# Patient Record
Sex: Female | Born: 1955 | Race: White | Hispanic: No | Marital: Married | Smoking: Never smoker
Health system: Southern US, Community
[De-identification: ages and names within clinical notes are randomized; demographics above are authoritative.]

## PROBLEM LIST (undated history)

## (undated) DIAGNOSIS — E079 Disorder of thyroid, unspecified: Secondary | ICD-10-CM

## (undated) DIAGNOSIS — Z9889 Other specified postprocedural states: Secondary | ICD-10-CM

## (undated) DIAGNOSIS — M1990 Unspecified osteoarthritis, unspecified site: Secondary | ICD-10-CM

## (undated) DIAGNOSIS — E039 Hypothyroidism, unspecified: Secondary | ICD-10-CM

## (undated) DIAGNOSIS — R112 Nausea with vomiting, unspecified: Secondary | ICD-10-CM

## (undated) HISTORY — DX: Disorder of thyroid, unspecified: E07.9

---

## 1998-09-11 ENCOUNTER — Ambulatory Visit (HOSPITAL_COMMUNITY): Admission: RE | Admit: 1998-09-11 | Discharge: 1998-09-11 | Payer: Self-pay | Admitting: *Deleted

## 1998-09-12 ENCOUNTER — Ambulatory Visit (HOSPITAL_COMMUNITY): Admission: RE | Admit: 1998-09-12 | Discharge: 1998-09-12 | Payer: Self-pay | Admitting: *Deleted

## 1998-10-15 ENCOUNTER — Encounter: Payer: Self-pay | Admitting: *Deleted

## 1998-10-15 ENCOUNTER — Ambulatory Visit (HOSPITAL_COMMUNITY): Admission: RE | Admit: 1998-10-15 | Discharge: 1998-10-15 | Payer: Self-pay | Admitting: *Deleted

## 1998-10-25 HISTORY — PX: CHOLECYSTECTOMY: SHX55

## 2000-11-11 ENCOUNTER — Emergency Department (HOSPITAL_COMMUNITY): Admission: EM | Admit: 2000-11-11 | Discharge: 2000-11-11 | Payer: Self-pay | Admitting: Emergency Medicine

## 2000-11-11 ENCOUNTER — Encounter: Payer: Self-pay | Admitting: Gastroenterology

## 2000-11-12 ENCOUNTER — Ambulatory Visit (HOSPITAL_COMMUNITY): Admission: RE | Admit: 2000-11-12 | Discharge: 2000-11-12 | Payer: Self-pay | Admitting: Gastroenterology

## 2000-11-14 ENCOUNTER — Encounter: Payer: Self-pay | Admitting: Gastroenterology

## 2000-11-14 ENCOUNTER — Ambulatory Visit (HOSPITAL_COMMUNITY): Admission: RE | Admit: 2000-11-14 | Discharge: 2000-11-14 | Payer: Self-pay | Admitting: Gastroenterology

## 2004-09-24 HISTORY — PX: JOINT REPLACEMENT: SHX530

## 2005-03-30 ENCOUNTER — Ambulatory Visit: Payer: Self-pay | Admitting: Specialist

## 2005-05-03 ENCOUNTER — Ambulatory Visit: Payer: Self-pay | Admitting: Specialist

## 2006-09-24 DIAGNOSIS — M1990 Unspecified osteoarthritis, unspecified site: Secondary | ICD-10-CM

## 2006-09-24 HISTORY — DX: Unspecified osteoarthritis, unspecified site: M19.90

## 2007-09-25 HISTORY — PX: COLONOSCOPY: SHX174

## 2007-12-17 ENCOUNTER — Ambulatory Visit (HOSPITAL_BASED_OUTPATIENT_CLINIC_OR_DEPARTMENT_OTHER): Admission: RE | Admit: 2007-12-17 | Discharge: 2007-12-17 | Payer: Self-pay | Admitting: Orthopedic Surgery

## 2008-01-06 ENCOUNTER — Encounter: Payer: Self-pay | Admitting: Orthopedic Surgery

## 2008-01-23 ENCOUNTER — Encounter: Payer: Self-pay | Admitting: Orthopedic Surgery

## 2008-02-23 ENCOUNTER — Encounter: Payer: Self-pay | Admitting: Orthopedic Surgery

## 2008-03-24 ENCOUNTER — Encounter: Payer: Self-pay | Admitting: Orthopedic Surgery

## 2010-09-24 HISTORY — PX: JOINT REPLACEMENT: SHX530

## 2011-02-06 NOTE — Op Note (Signed)
NAMEJENI, Gabriela                 ACCOUNT NO.:  [id number]   MEDICAL RECORD NO.:  [id number]          PATIENT TYPE:  AMB   LOCATION:  [facility]                         FACILITY:  [facility]   PHYSICIAN:  Ollen Gross, M.D.    DATE OF BIRTH:  04/13/1956   DATE OF PROCEDURE:  12/17/2007  DATE OF DISCHARGE:                               OPERATIVE REPORT   PREOPERATIVE DIAGNOSIS:  Left knee medial meniscal tear.   POSTOPERATIVE DIAGNOSIS:  Left knee medial meniscal tear plus chondral  defect.   PROCEDURE:  Left knee arthroscopy with meniscal debridement and  chondroplasty.   SURGEON:  Ollen Gross, M.D.   ASSISTANT:  None.   ANESTHESIA:  General.   ESTIMATED BLOOD LOSS:  Minimal.   DRAINS:  None.   COMPLICATIONS:  None.   CONDITION:  Stable to recovery.   BRIEF CLINICAL NOTE:  Gabriela [name] is a 55 year old female with very  significant left knee pain and mechanical symptoms.  She had an exam and  history consistent with meniscal tear confirmed by MRI.  She presents  now for meniscal debridement and chondroplasty.   PROCEDURE IN DETAIL:  After successful administration of general  anesthetic, tourniquet was placed high on the left thigh, left lower  extremity prepped and draped in the usual sterile fashion.  Standard  superomedial and inferolateral portals are established.  Inflow cannula  passed superomedial and camera passed inferolateral.  Arthroscopic  visualization proceeds.  Undersurface of the patella has grade II  changes and so does the trochlea.  The medial and lateral gutters were  visualized and there were no loose bodies.  Flexion and valgus force was  applied to the knee and the medial compartment was entered.  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  Spinal needle was used to localize  the inferomedial portal.  A small  incision made and dilator placed.  The  meniscus was debrided back to a stable base with baskets and a 4.2 mm  shaver.  Chondromalacia on the medial femoral condyle and tibial plateau  was debrided back to a stable base with the shaver.  On the medial  femoral condyle about a 1 x 2 cm area of the cartilage was unstable  almost down to bone.  There was very little cartilage covering the bone  in that the area.  There was some lower grade chondromalacia surrounding  that and then the rest of the condyle looked normal.  Medial tibial  plateau just had some grade I change.  The intercondylar notch was  visualized.  There was a fair amount of synovitis but the ACL and PCL  looked normal.  I debrided this hypertrophic synovium with the  ArthroCare.  The lateral compartment was entered and it looks normal.   We then addressed the patellofemoral compartment and the unstable  cartilage on the surface of the trochlea is debrided back to a stable  base.  This was involving most of the  central trochlea and medial  trochlea.  It was not down all the way to exposed bone.  At the  undersurface of the patella we also debrided the cartilage back to a  stable base.  The joints were again inspected.  No further tears or  loose bodies.  Arthroscopic equipment was removed from the inferior  portals which were closed with interrupted 4-0 nylon.  Twenty mL of  quarter percent Marcaine with epi are injected through the inflow  cannula which was then removed.  That portal was closed with nylon.  A  bulky sterile dressing is applied and she is awakened and transported to  recovery in stable condition.      Ollen Gross, M.D.  Electronically Signed     FA/MEDQ  D:  12/17/2007  T:  12/17/2007  Job:  [id number]

## 2011-03-12 ENCOUNTER — Other Ambulatory Visit: Payer: Self-pay | Admitting: Orthopedic Surgery

## 2011-03-12 ENCOUNTER — Encounter (HOSPITAL_COMMUNITY): Payer: BC Managed Care – PPO

## 2011-03-12 LAB — URINALYSIS, ROUTINE W REFLEX MICROSCOPIC
Bilirubin Urine: NEGATIVE
Glucose, UA: NEGATIVE mg/dL
Hgb urine dipstick: NEGATIVE
Ketones, ur: NEGATIVE mg/dL
Leukocytes, UA: NEGATIVE
Nitrite: NEGATIVE
Protein, ur: NEGATIVE mg/dL
Specific Gravity, Urine: 1.01 (ref 1.005–1.030)
Urobilinogen, UA: 0.2 mg/dL (ref 0.0–1.0)
pH: 8 (ref 5.0–8.0)

## 2011-03-12 LAB — CBC
HCT: 46.3 % — ABNORMAL HIGH (ref 36.0–46.0)
Hemoglobin: 15.5 g/dL — ABNORMAL HIGH (ref 12.0–15.0)
MCH: 28.2 pg (ref 26.0–34.0)
MCHC: 33.5 g/dL (ref 30.0–36.0)
MCV: 84.2 fL (ref 78.0–100.0)
Platelets: 242 10*3/uL (ref 150–400)
RBC: 5.5 MIL/uL — ABNORMAL HIGH (ref 3.87–5.11)
RDW: 12.7 % (ref 11.5–15.5)
WBC: 5.5 10*3/uL (ref 4.0–10.5)

## 2011-03-12 LAB — SURGICAL PCR SCREEN
MRSA, PCR: NEGATIVE
Staphylococcus aureus: NEGATIVE

## 2011-03-12 LAB — COMPREHENSIVE METABOLIC PANEL
ALT: 23 U/L (ref 0–35)
AST: 23 U/L (ref 0–37)
Albumin: 4.3 g/dL (ref 3.5–5.2)
Alkaline Phosphatase: 118 U/L — ABNORMAL HIGH (ref 39–117)
BUN: 14 mg/dL (ref 6–23)
CO2: 28 mEq/L (ref 19–32)
Calcium: 10.2 mg/dL (ref 8.4–10.5)
Chloride: 102 mEq/L (ref 96–112)
Creatinine, Ser: 0.62 mg/dL (ref 0.50–1.10)
GFR calc Af Amer: >60 mL/min (ref >60)
GFR calc non Af Amer: >60 mL/min (ref >60)
Glucose, Bld: 86 mg/dL (ref 70–99)
Potassium: 4 mEq/L (ref 3.5–5.1)
Sodium: 138 mEq/L (ref 135–145)
Total Bilirubin: 0.5 mg/dL (ref 0.3–1.2)
Total Protein: 7.4 g/dL (ref 6.0–8.3)

## 2011-03-12 LAB — APTT: aPTT: 30 seconds (ref 24–37)

## 2011-03-12 LAB — PROTIME-INR
INR: 0.94 (ref 0.00–1.49)
Prothrombin Time: 12.8 seconds (ref 11.6–15.2)

## 2011-03-19 ENCOUNTER — Inpatient Hospital Stay (HOSPITAL_COMMUNITY)
Admission: RE | Admit: 2011-03-19 | Discharge: 2011-03-22 | DRG: 209 | Disposition: A | Discharge status: Home Health | Payer: BC Managed Care – PPO | Source: Ambulatory Visit | Attending: Orthopedic Surgery | Admitting: Orthopedic Surgery

## 2011-03-19 DIAGNOSIS — E039 Hypothyroidism, unspecified: Secondary | ICD-10-CM | POA: Diagnosis present

## 2011-03-19 DIAGNOSIS — Z01812 Encounter for preprocedural laboratory examination: Secondary | ICD-10-CM

## 2011-03-19 DIAGNOSIS — M1710 Unilateral primary osteoarthritis, unspecified knee: Principal | ICD-10-CM | POA: Diagnosis present

## 2011-03-19 DIAGNOSIS — I9589 Other hypotension: Secondary | ICD-10-CM | POA: Diagnosis not present

## 2011-03-19 LAB — ABO/RH: ABO/RH(D): A POS

## 2011-03-20 LAB — CBC
HCT: 36.3 % (ref 36.0–46.0)
MCV: 84 fL (ref 78.0–100.0)
Platelets: 167 10*3/uL (ref 150–400)
RBC: 4.32 MIL/uL (ref 3.87–5.11)
RDW: 12.7 % (ref 11.5–15.5)
WBC: 6.8 10*3/uL (ref 4.0–10.5)

## 2011-03-20 LAB — BASIC METABOLIC PANEL
BUN: 11 mg/dL (ref 6–23)
CO2: 25 mEq/L (ref 19–32)
Chloride: 106 mEq/L (ref 96–112)
Creatinine, Ser: 0.57 mg/dL (ref 0.50–1.10)
GFR calc Af Amer: >60 mL/min (ref >60)
Potassium: 4.2 mEq/L (ref 3.5–5.1)

## 2011-03-21 LAB — BASIC METABOLIC PANEL
BUN: 9 mg/dL (ref 6–23)
Chloride: 104 mEq/L (ref 96–112)
Creatinine, Ser: 0.55 mg/dL (ref 0.50–1.10)
GFR calc Af Amer: >60 mL/min (ref >60)
Glucose, Bld: 110 mg/dL — ABNORMAL HIGH (ref 70–99)
Potassium: 3.6 mEq/L (ref 3.5–5.1)

## 2011-03-21 LAB — CBC
HCT: 34.2 % — ABNORMAL LOW (ref 36.0–46.0)
Hemoglobin: 11.1 g/dL — ABNORMAL LOW (ref 12.0–15.0)
MCV: 84.7 fL (ref 78.0–100.0)
RDW: 12.8 % (ref 11.5–15.5)
WBC: 9 10*3/uL (ref 4.0–10.5)

## 2011-03-21 NOTE — Op Note (Signed)
Gabriela [name], KULAKOWSKI NO.:  [id number]  MEDICAL RECORD NO.:  [id number]  LOCATION:  1617                         FACILITY:  [facility]  PHYSICIAN:  Ollen Gross, M.D.    DATE OF BIRTH:  [birth year]  DATE OF PROCEDURE:  03/19/2011 DATE OF DISCHARGE:                              OPERATIVE REPORT   PREOPERATIVE DIAGNOSIS:  Osteoarthritis right knee.  POSTOPERATIVE DIAGNOSIS:  Osteoarthritis right knee.  PROCEDURE:  Right total knee arthroplasty.  SURGEON:  Ollen Gross, M.D.  ASSISTANT:  Alexzandrew L. Perkins, P.A.C.  ANESTHESIA:  Spinal.  ESTIMATED BLOOD LOSS:  Minimal.  DRAINS:  Hemovac x1.  TOURNIQUET TIME:  37 minutes at 300 mmHg.  COMPLICATIONS:  None.  CONDITION:  Stable to recovery room.  BRIEF CLINICAL NOTE:  Gabriela [name] is a 55 year old female, who has advanced end-stage arthritis of the knees, right more symptomatic than the left. She has failed nonoperative management and presents for total knee arthroplasty. PROCEDURE IN DETAIL:  After successful administration of spinal anesthetic, a tourniquet was placed high on the right thigh and right lower extremity prepped and draped in a usual sterile fashion. Extremities were wrapped in Esmarch, knee flexed, tourniquet inflated to 300 mmHg.  A midline incision was made with 10 blade through subcutaneous tissue to the level of the extensor mechanism.  A fresh blade is used make a medial parapatellar arthrotomy.  Soft tissue on the proximal medial tibia is subperiosteally elevated to the joint line with the knife into the semimembranosus bursa with a Cobb elevator.  Soft tissue laterally is elevated with attention being paid to avoid the patellar tendon on tibial tubercle.  The patella was everted, knee flexed to 90 degrees and ACL and PCL removed.  Drill was used create a starting hole in the distal femur.  The canal was thoroughly irrigated. The 5-degree right valgus alignment guide is placed and  distal femoral cutting block is pinned to remove 10 mm off the distal femur.  Resection is made with an oscillating saw.  Tibia subluxed forward and the menisci are removed.  The extramedullary tibial alignment guide is placed referencing proximally at the medial aspect of the tibial tubercle and distally along the second metatarsal axis and tibial crest.  The block is pinned to remove 2 mm off the more deficient medial side.  Tibial resection is made with an oscillating saw.  Size 3 is the most appropriate tibial component.  The proximal tibia was prepared with modular drill and keel punch for the size 3.  The femoral sizing guide is placed and size 3 is most appropriate. Rotation is marked off the epicondylar axis and confirmed by creating rectangular flexion gap at 90 degrees.  The block is pinned in that rotation and the anterior-posterior chamfer cuts are made. Intercondylar block is placed and that cut is made.  Trial size 3 posterior stabilized femur was placed.  The 12.5 mm posterior stabilized rotating platform insert was placed.  There is a little play with this and the 15 mm, which allowed for full extension with excellent varus- valgus and an anterior-posterior balance throughout full range of motion.  The patella was everted and thickness measured  22 mm.  Freehand resection taken to 12 mm, 38 template is placed, lug holes were drilled, trial patella was placed and it tracks normally.  Osteophytes removed off the posterior femur with the trial in place.  All trials are were removed and the cut bone surfaces prepared with pulsatile lavage. Cement is mixed and once ready for implantation, the size 3 mobile bearing tibial tray, size 3 posterior stabilized femur and 38 patella are cemented into place and patella was held with a clamp.  The trial 15- mm insert is placed, knee held in full extension, all extruded cement removed.  Cement fully hardened when the permanent 15-mm  posterior stabilized rotating platform insert was placed in the tibial tray.  The wound was copiously irrigated with saline solution and the arthrotomy closed over Hemovac drain with interrupted #1 PDS.  Flexion against gravity about 135 degrees and the patella tracks normally.  Tourniquet is released at a total time of 37 minutes.  Subcutaneous was closed with interrupted 2-0 Vicryl and subcuticular running 4-0 Monocryl.  Catheter for Marcaine pain pump was placed and pump is initiated.  The incisions cleaned and dried.  Steri-Strips and bulky sterile dressing were applied.  She is then placed into a knee immobilizer, awakened and transported to recovery in stable condition.     Ollen Gross, M.D.     FA/MEDQ  D:  03/19/2011  T:  03/19/2011  Job:  [id number]  Electronically Signed by Ollen Gross M.D. on 03/21/2011 10:09:20 AM

## 2011-03-22 LAB — CBC
HCT: 32.3 % — ABNORMAL LOW (ref 36.0–46.0)
Hemoglobin: 10.7 g/dL — ABNORMAL LOW (ref 12.0–15.0)
MCH: 27.6 pg (ref 26.0–34.0)
MCHC: 33.1 g/dL (ref 30.0–36.0)
MCV: 83.5 fL (ref 78.0–100.0)
RBC: 3.87 MIL/uL (ref 3.87–5.11)

## 2011-04-05 ENCOUNTER — Encounter: Payer: Self-pay | Admitting: Orthopedic Surgery

## 2011-04-09 NOTE — H&P (Signed)
  NAMEAZANI, BROGDON NO.:  [id number]  MEDICAL RECORD NO.:  [id number]  LOCATION:                                 FACILITY:  PHYSICIAN:  Ollen Gross, M.D.    DATE OF BIRTH:  June 22, 1956  DATE OF ADMISSION: DATE OF DISCHARGE:                             HISTORY & PHYSICAL   DATE OF ADMISSION:  March 19, 2011  CHIEF COMPLAINT:  Right knee pain.  HISTORY OF PRESENT ILLNESS:  The patient is a 55 year old female who has been seen by Dr. Lequita Halt for ongoing right knee pain.  She has a known significant arthritis, progressively getting worse.  It is felt she would benefit from undergoing surgical intervention.  Risks and benefits have been discussed.  She elected to proceed with surgery.  ALLERGIES:  ERYTHROMYCIN, SULFA, AMPICILLIN, TETRACYCLINE.  INTOLERANCES:  VICODIN (please note she is able to take oxycodone).  CURRENT MEDICATIONS:  Armour Thyroid.  PAST MEDICAL HISTORY:  Hypothyroidism, postmenopausal.  PAST SURGICAL HISTORY:  Cholecystectomy, right knee meniscal repair, left knee meniscal repair.  FAMILY HISTORY:  Father with pancreatic cancer.  SOCIAL HISTORY:  Married.  Network engineer.  Nonsmoker.  No alcohol. She does have caregiver lined up.  She does have a living will.  REVIEW OF SYSTEMS:  GENERAL:  No fevers, chills, or night sweats. NEUROLOGIC:  No seizures, syncope, or paralysis.  RESPIRATORY:  No shortness of breath, productive cough, or hemoptysis.  CARDIOVASCULAR: No chest pain or orthopnea.  GI:  No nausea, vomiting, diarrhea, or constipation.  GU:  No dysuria, hematuria, or discharge. MUSCULOSKELETAL:  Right knee.  PHYSICAL EXAMINATION:  VITAL SIGNS:  Pulse 76, respirations 12, blood pressure 134/92. GENERAL:  55 year old white female, well nourished, well developed, tall frame, alert, oriented and cooperative, very pleasant, excellent historian. HEENT:  Normocephalic, atraumatic.  Pupils are round and reactive.   EOMs intact. NECK:  Supple. CHEST:  Clear. HEART:  Regular rate and rhythm without murmur, S1 and S2. ABDOMEN:  Soft, nontender.  Bowel sounds present. RECTAL/BREASTS/GENITALIA:  Not done, not part of present illness. EXTREMITIES:  Right knee, significant varus malalignment deformity, range of motion 5 to 130, marked crepitus, tender more medial than lateral, no instability.  IMPRESSION:  Osteoarthritis, right knee.  PLAN:  The patient will be admitted to [facility] to undergo a right total knee replacement arthroplasty.  Surgery will be performed by Dr. Ollen Gross.     Alexzandrew L. Julien Girt, P.A.C.   ______________________________ Ollen Gross, M.D.    ALP/MEDQ  D:  03/27/2011  T:  03/27/2011  Job:  [id number]  cc:   Allena Napoleon Fax: [phone]  Electronically Signed by Patrica Duel P.A.C. on 04/05/2011 10:38:35 AM Electronically Signed by Ollen Gross M.D. on 04/09/2011 06:52:20 AM

## 2011-04-19 NOTE — Discharge Summary (Signed)
[name], [name] NO.:  [id number]  MEDICAL RECORD NO.:  [id number]  LOCATION:  1617                         FACILITY:  [facility]  PHYSICIAN:  Gabriela [name], M.D.    DATE OF BIRTH:  10/26/1955  DATE OF ADMISSION:  03/19/2011 DATE OF DISCHARGE:  03/22/2011                              DISCHARGE SUMMARY   ADMITTING DIAGNOSES: 1. Osteoarthritis, right knee. 2. Hypothyroidism. 3. Postmenopausal.  DISCHARGE DIAGNOSES: 1. Osteoarthritis, right knee, status post right total knee     replacement arthroplasty. 2. Hypothyroidism. 3. Postmenopausal.  PROCEDURES:  On March 19, 2011, right total knee.  SURGEON:  Gabriela [name], M.D.  ASSISTANT:  Gabriela [name], P.A.C.  ANESTHESIA:  Spinal anesthesia.  TOURNIQUET TIME:  37 minutes.  CONSULTS:  None.  BRIEF HISTORY:  The patient is a 55 year old female with advanced arthritis of the right knee, right more symptomatic than left, now presents for a total knee arthroplasty.  LABORATORY DATA:  Admission CBC showed a hemoglobin of 15.5.  Serial CBCs were followed.  Hemoglobin dropped down from 11.9 to 11.1, last H  H at 10.7 and 32.3.  Chem panel not found in the chart, not scanned in, but the BMETs were followed for 48 hours.  Electrolytes remained all within normal limits.  The blood group type A+.  HOSPITAL COURSE:  The patient was admitted to [facility], taken to OR, underwent above-stated procedure without complication.  The patient tolerated the procedure well.  Later transferred to recovery room from orthopedic floor, started on p.o. and IV analgesic pain control following surgery given 24 hours postop IV antibiotics.  Started on Xarelto for DVT prophylaxis.  Allowed to be weightbearing as tolerated.  Did have a little bit of nausea on day #1, so we discontinued the PCA and switched over to different meds.  Hemovac drain was pulled.  Started, again, up out of bed on day #1.  By day #2,  nausea had improved, she was doing well, progressing with therapy, dressing change, incision looked good, no signs of infection, hemoglobin is 11.1, continue with therapy; and by day #3, the patient was doing well and had a vagal episode at the night before and had rapid response team come, her pressure had dropped down into the 60s but had responded, she was back up to 102 and on morning rounds, her pressure systolic was 114, we checked orthostatics and gave her additional fluids as needed.  We are monitored and we are going to make sure she did well with her therapy. She did have 2 sessions of therapy.  Hemoglobin was 10.7, it is felt to be probably a combination multifactorial of medications, fluids, and being postop but she did well later that day and she was discharged home.  DISCHARGE/PLAN: 1. Discharged home on March 22, 2011. 2. Discharge diagnoses, please see above. 3. Discharge medications:  Robaxin, Xarelto, Ultram, Armour Thyroid,     fluconazole, niacin. 4. Diet as tolerated. 5. Activity, weightbearing as tolerated.  Total knee protocol. 6. Followup 2 weeks.  DISPOSITION:  Home.  CONDITION ON DISCHARGE:  Improved.     Gabriela [name], P.A.C.   ______________________________ Gabriela [name],  M.D.    ALP/MEDQ  D:  04/12/2011  T:  04/12/2011  Job:  [id number]  cc:   Gabriela [name] Fax: [phone]  Electronically Signed by Gabriela [name] P.A.C. on 04/17/2011 09:28:43 AM Electronically Signed by Gabriela [name] M.D. on 04/19/2011 03:30:25 PM

## 2011-04-25 ENCOUNTER — Encounter: Payer: Self-pay | Admitting: Orthopedic Surgery

## 2011-05-26 ENCOUNTER — Encounter: Payer: Self-pay | Admitting: Orthopedic Surgery

## 2011-06-18 ENCOUNTER — Other Ambulatory Visit (INDEPENDENT_AMBULATORY_CARE_PROVIDER_SITE_OTHER): Payer: Self-pay | Admitting: General Surgery

## 2011-06-18 ENCOUNTER — Ambulatory Visit (INDEPENDENT_AMBULATORY_CARE_PROVIDER_SITE_OTHER): Payer: BC Managed Care – PPO | Admitting: General Surgery

## 2011-06-18 ENCOUNTER — Encounter (INDEPENDENT_AMBULATORY_CARE_PROVIDER_SITE_OTHER): Payer: Self-pay | Admitting: General Surgery

## 2011-06-18 VITALS — BP 126/68 | HR 66 | Temp 96.7°F | Resp 16 | Ht 66.5 in | Wt 184.8 lb

## 2011-06-18 DIAGNOSIS — R920 Mammographic microcalcification found on diagnostic imaging of breast: Secondary | ICD-10-CM

## 2011-06-18 LAB — POCT HEMOGLOBIN-HEMACUE: Operator id: 114531

## 2011-06-18 NOTE — Progress Notes (Signed)
Chief Complaint  Patient presents with  . Other    Left breast calcifications    HPI Gabriela [name] is a 55 y.o. female.   HPI  She has been referred by Dr. Mia Creek for evaluation of increasing left breast microcalcifications. She's been having 6 month mammogram following these.  She had a previous image guided biopsy of that breast which was benign. Her latest mammogram on May 23, 2011 demonstrated a slightly increased number of these calcifications. This was raised a BI-RADS 4 and biopsy was recommended. Gabriela [name] did not want to image guided biopsy rather she wanted open biopsy and I told Dr. Ananias Pilgrim I would be glad to see her regarding this.  She has a family history of breast cancer. Her mother had breast cancer. Her maternal aunt had breast cancer.  Her first period was at age 67 or 22. No pregnancies. Menopause at age 57.  Past Medical History  Diagnosis Date  . Thyroid disease     Past Surgical History  Procedure Date  . Cholecystectomy 1999  . Joint replacement 2012    right    Family History  Problem Relation Age of Onset  . Cancer Mother     breast  . Cancer Father 52    pancreatic  . Cancer Maternal Aunt     breast  . Cancer Paternal Grandfather     lung    Social History History  Substance Use Topics  . Smoking status: Never Smoker   . Smokeless tobacco: Not on file  . Alcohol Use: No    Allergies  Allergen Reactions  . Ampicillin Hives  . Erythromycin Hives  . Sulfa Antibiotics Hives    All over chest    Current Outpatient Prescriptions  Medication Sig Dispense Refill  . Acetylcysteine (N-ACETYL-L-CYSTEINE) 600 MG CAPS Take by mouth daily.        . Ascorbic Acid (VITAMIN C PO) Take 480 mg by mouth daily.        . Cholecalciferol (D3 ADULT PO) Take by mouth daily.        . CHROMIUM PO Take 600 mcg by mouth daily.        . Coenzyme Q10 (CO Q 10 PO) Take 100 mg by mouth daily.        Marland Kitchen DHEA 10 MG CAPS Take by mouth daily.        Marland Kitchen  DIGESTIVE ENZYMES PO Take by mouth 2 (two) times daily.        . Evening Primrose topical oil Apply topically as needed.        . fish oil-omega-3 fatty acids 1000 MG capsule Take 2 g by mouth daily.        Marland Kitchen MAGNESIUM GLYCINATE PLUS PO Take 400 mg by mouth.        . Multiple Minerals (MULTI-MINERALS PO) Take by mouth daily.        . niacin (NIASPAN) 1000 MG CR tablet Take 1,000 mg by mouth at bedtime.        Marland Kitchen POTASSIUM PO Take 100 mg by mouth daily. With iodine       . thyroid (ARMOUR) 120 MG tablet Take 120 mg by mouth daily.          Review of Systems Review of Systems  Constitutional: Negative.   HENT: Negative.   Respiratory: Negative.   Cardiovascular: Negative.   Genitourinary: Negative.   Neurological: Negative.   Hematological: Negative.     Blood pressure 126/68, pulse  66, temperature 96.7 F (35.9 C), temperature source Temporal, resp. rate 16, height 5' 6.5" (1.689 m), weight 184 lb 12.8 oz (83.825 kg).  Physical Exam Physical Exam  Constitutional: She appears well-developed and well-nourished. No distress.  Eyes: Conjunctivae and EOM are normal.  Neck: Normal range of motion. Neck supple.  Cardiovascular: Normal rate and regular rhythm.   No murmur heard. Pulmonary/Chest: Effort normal and breath sounds normal.       Right breast-no dominant masses, suspicious skin changes, nipple discharge.  Left breast-no dominant masses, suspicious skin changes, nipple discharge.  Abdominal: Soft. She exhibits no distension and no mass.  Musculoskeletal: She exhibits no edema.  Lymphadenopathy:    She has no cervical adenopathy.    Data Reviewed Mammograms and reports.  Assessment    Increasing left breast calcifications upper outer quadrant. Biopsy is recommended. The patient prefers an open biopsy.    Plan    Left breast biopsy after needle localization.  I have explained the procedure, risks, and aftercare to her.  Risks include but are not limited to bleeding,  infection, wound problems, anesthesia.  She seems to understand and agrees with the plan.       Gabriela [name] J 06/18/2011, 1:58 PM

## 2011-06-20 ENCOUNTER — Encounter (HOSPITAL_BASED_OUTPATIENT_CLINIC_OR_DEPARTMENT_OTHER)
Admission: RE | Admit: 2011-06-20 | Discharge: 2011-06-20 | Disposition: A | Discharge status: Home / Self Care | Payer: BC Managed Care – PPO | Source: Ambulatory Visit | Attending: General Surgery | Admitting: General Surgery

## 2011-06-20 ENCOUNTER — Ambulatory Visit
Admission: RE | Admit: 2011-06-20 | Discharge: 2011-06-20 | Disposition: A | Discharge status: Home / Self Care | Payer: BC Managed Care – PPO | Source: Ambulatory Visit | Attending: General Surgery | Admitting: General Surgery

## 2011-06-20 ENCOUNTER — Other Ambulatory Visit (INDEPENDENT_AMBULATORY_CARE_PROVIDER_SITE_OTHER): Payer: Self-pay | Admitting: General Surgery

## 2011-06-20 DIAGNOSIS — Z01811 Encounter for preprocedural respiratory examination: Secondary | ICD-10-CM

## 2011-06-20 LAB — CBC
HCT: 40.5 % (ref 36.0–46.0)
Hemoglobin: 13.5 g/dL (ref 12.0–15.0)
MCHC: 33.3 g/dL (ref 30.0–36.0)
MCV: 81 fL (ref 78.0–100.0)
RDW: 13.5 % (ref 11.5–15.5)

## 2011-06-20 LAB — BASIC METABOLIC PANEL
BUN: 11 mg/dL (ref 6–23)
CO2: 27 mEq/L (ref 19–32)
Chloride: 100 mEq/L (ref 96–112)
GFR calc Af Amer: >60 mL/min (ref >60)
Potassium: 4.1 mEq/L (ref 3.5–5.1)

## 2011-06-20 LAB — DIFFERENTIAL
Basophils Absolute: 0 10*3/uL (ref 0.0–0.1)
Eosinophils Relative: 1 % (ref 0–5)
Lymphocytes Relative: 30 % (ref 12–46)
Lymphs Abs: 1.6 10*3/uL (ref 0.7–4.0)
Monocytes Absolute: 0.5 10*3/uL (ref 0.1–1.0)
Monocytes Relative: 9 % (ref 3–12)
Neutro Abs: 3.4 10*3/uL (ref 1.7–7.7)

## 2011-06-20 LAB — PROTIME-INR: INR: 0.99 (ref 0.00–1.49)

## 2011-06-21 ENCOUNTER — Ambulatory Visit
Admission: RE | Admit: 2011-06-21 | Discharge: 2011-06-21 | Disposition: A | Discharge status: Home / Self Care | Payer: BC Managed Care – PPO | Source: Ambulatory Visit | Attending: General Surgery | Admitting: General Surgery

## 2011-06-21 ENCOUNTER — Ambulatory Visit (HOSPITAL_BASED_OUTPATIENT_CLINIC_OR_DEPARTMENT_OTHER)
Admission: RE | Admit: 2011-06-21 | Discharge: 2011-06-21 | Disposition: A | Discharge status: Home / Self Care | Payer: BC Managed Care – PPO | Source: Ambulatory Visit | Attending: General Surgery | Admitting: General Surgery

## 2011-06-21 ENCOUNTER — Other Ambulatory Visit (INDEPENDENT_AMBULATORY_CARE_PROVIDER_SITE_OTHER): Payer: Self-pay | Admitting: General Surgery

## 2011-06-21 DIAGNOSIS — R920 Mammographic microcalcification found on diagnostic imaging of breast: Secondary | ICD-10-CM | POA: Insufficient documentation

## 2011-06-21 DIAGNOSIS — N6019 Diffuse cystic mastopathy of unspecified breast: Secondary | ICD-10-CM

## 2011-06-21 DIAGNOSIS — Z01812 Encounter for preprocedural laboratory examination: Secondary | ICD-10-CM | POA: Insufficient documentation

## 2011-06-21 HISTORY — PX: BREAST SURGERY: SHX581

## 2011-06-25 ENCOUNTER — Encounter: Payer: Self-pay | Admitting: Orthopedic Surgery

## 2011-07-04 ENCOUNTER — Ambulatory Visit (INDEPENDENT_AMBULATORY_CARE_PROVIDER_SITE_OTHER): Payer: BC Managed Care – PPO | Admitting: General Surgery

## 2011-07-04 ENCOUNTER — Encounter (INDEPENDENT_AMBULATORY_CARE_PROVIDER_SITE_OTHER): Payer: Self-pay | Admitting: General Surgery

## 2011-07-04 VITALS — BP 126/84 | HR 60 | Temp 97.0°F | Resp 16 | Ht 66.5 in | Wt 177.2 lb

## 2011-07-04 DIAGNOSIS — N6459 Other signs and symptoms in breast: Secondary | ICD-10-CM

## 2011-07-04 DIAGNOSIS — N6019 Diffuse cystic mastopathy of unspecified breast: Secondary | ICD-10-CM

## 2011-07-04 NOTE — Progress Notes (Signed)
Operation: Left breast biopsy after needle localization  Date: June 21, 2011  Pathology: Benign fibrocystic change. No malignancy identified.  HPI:  Gabriela [name] is here for her first postoperative visit. She has had significant swelling bruising and discomfort. It is getting a little better.   Physical Exam: Left breast-the incision is clean and intact but there significant surrounding ecchymosis; no erythema and no drainage.   Assessment: Postoperative left breast hematoma following left breast biopsy after needle localization.  Plan: Wear a support bra as much as possible. Apply heat to the area. Return visit 4 weeks. I told her this could take a long time to resolve.

## 2011-07-04 NOTE — Patient Instructions (Signed)
Wear a support bra as much as possible. Apply a heating pad to the area for short periods of time. Once the Steri-Strips fall off, start using the Mederma as directed.

## 2011-07-05 NOTE — Op Note (Signed)
  NAMESTEFANY, STARACE NO.:  [id number]  MEDICAL RECORD NO.:  [id number]  LOCATION:                                 FACILITY:  PHYSICIAN:  Adolph Pollack, M.D.DATE OF BIRTH:  January 12, 1956  DATE OF PROCEDURE:  06/21/2011 DATE OF DISCHARGE:                              OPERATIVE REPORT   PREOPERATIVE DIAGNOSIS:  Abnormal left breast microcalcifications.  POSTOPERATIVE DIAGNOSIS:  Abnormal left breast microcalcifications.  PROCEDURE:  Left breast biopsy needle localization.  SURGEON:  Adolph Pollack, MD  ANESTHESIA:  General/LMA plus Marcaine local.  INDICATIONS:  Mrs. Liska is a 55 year old female who has had increasing left breast microcalcifications.  A stereotactic biopsy was recommended. However, she did not want the image-guided biopsy rather she wanted an open biopsy.  She discussed with Dr. Alessandra Bevels who discussed with me and I agreed to see her.  WE had discussed that typically we do the needle biopsy first in case further imaging and procedures were needed but I did agree to go ahead and do the needle localization open biopsy as she preferred.  We went over the procedure and the risks.  TECHNIQUE:  She underwent a successful needle localization at the breast center.  She was then seen in the holding area and left breast marked with my initials.  Her x-rays were reviewed.  She was brought to the operative room, placed supine on the operating room table and given a general anesthetic by LMA.  The bandage on the left breast was removed and the wire was cut so close to the skin.  A left breast was sterilely prepped and draped.  In the lateral position, I made a curvilinear incision through the skin and subcutaneous tissue and brought the wire into the wound.  Using electrocautery I then performed basically a left breast lumpectomy around the wire.  Once I had the specimen removed, I oriented so that the wire was lateral, a single suture was  placed anteriorly and double suture superiorly.  A Faxitron mammography was then performed and reviewed by the radiologist.  The microcalcifications were contained in the specimen. The specimen was then sent to Pathology.  Following this I inspected the wound and bleeding was controlled with electrocautery.  Once hemostasis was adequate, I injected local anesthetic into the wound consisting of 0.5% plain Marcaine superficially and deep.  I then closed the wound and by approximating the subcutaneous tissue with interrupted 3-0 Vicryl sutures.  The skin was closed with a running 4-0 Monocryl subcuticular stitch.  Steri- Strips and sterile dressing were applied.  She tolerated the procedure without apparent complications and was taken to the recovery room in satisfactory condition.     Adolph Pollack, M.D.     Kari Baars  D:  06/21/2011  T:  06/21/2011  Job:  [id number]  Electronically Signed by Avel Peace M.D. on 07/05/2011 10:27:20 AM

## 2011-07-20 ENCOUNTER — Telehealth (INDEPENDENT_AMBULATORY_CARE_PROVIDER_SITE_OTHER): Payer: Self-pay

## 2011-07-20 ENCOUNTER — Ambulatory Visit (INDEPENDENT_AMBULATORY_CARE_PROVIDER_SITE_OTHER): Payer: BC Managed Care – PPO | Admitting: General Surgery

## 2011-07-20 ENCOUNTER — Encounter (INDEPENDENT_AMBULATORY_CARE_PROVIDER_SITE_OTHER): Payer: Self-pay | Admitting: General Surgery

## 2011-07-20 VITALS — BP 116/88 | HR 60 | Temp 97.5°F | Resp 20 | Ht 66.5 in | Wt 177.0 lb

## 2011-07-20 DIAGNOSIS — N6489 Other specified disorders of breast: Secondary | ICD-10-CM

## 2011-07-20 DIAGNOSIS — IMO0002 Reserved for concepts with insufficient information to code with codable children: Secondary | ICD-10-CM

## 2011-07-20 NOTE — Progress Notes (Signed)
Operation: Left breast biopsy after needle localization  Date: June 21, 2011  Pathology: Benign fibrocystic change. No malignancy identified.  HPI:  Gabriela [name] is here for her another postop visit.  She was not having much discomfort in her breast, but the hematoma was not resolving much.  She saw Dr. Ananias Pilgrim and was given a large dose of Vitamin C and underwent a hyperbaric oxygen treatment on 10/25.  Two hours after the treatment she had severe left breast pain just underneath her nipple.  She has a little skin breakdown at the incision site with minimal drainage.  She states the swelling is much better except for the area around the incision.  She reports having a low grade fever at home.  She is concerned she may have an infection.     Physical Exam: Left breast-the incision is clean with one small area of skin breakdown; there is no drainage; the wide spread ecchymosis seen last time is improved and is now focused in the lateral breast where the incision is; this is firm but does not have increased warmth; there is no erythema.   Assessment: Postoperative left breast hematoma following left breast biopsy after needle localization- the hematoma has improved; there is no sign of infection. I suspect the hematoma is starting to liquify and she may experience a low grade fever as it resorbs. She is not having significant pain currently.  I do not feel HBO therapy is helpful with respect to a hematoma.  Plan: Continue to wear a bra as much as possible.  Apply antibiotic ointment and a bandaid to the area of skin breakdown daily.  Stop fish oil and Coenzyme Q.  Continue to allow the hematoma to resolve spontaneously.  Return visit next month.

## 2011-07-20 NOTE — Telephone Encounter (Signed)
Dr Allena Napoleon called concerned about a pt of Dr Maris Berger that was seen in her clinic yesterday for a follow up. The pt was having some drainage from the left breast that she had sx on by Dr Abbey Chatters on 06-21-11 so she advised the pt to sit in her Hyperbaric chamber. The pt also did receive a IV of Vitamin C at her office after the received the hyperbaric tx the pt started to experience severe breast pain that she hasn't ever felt before. The pt was advised to use ice on the area and not heat. The pt was advised to go get some Dorethea Clan to apply to area. The pt was in so much pain that she ended up going back to Dr Alessandra Bevels again yesterday b/c she was scared about the pain. Dr Alessandra Bevels ordered a shot of Toradol and CBC told the pt she would call us today to see about getting her seen by Dr Abbey Chatters. I made the appt for the pt today for Urgent office for 4pm. Dr Alessandra Bevels said she would contact the pt with the appt.Hulda Humphrey

## 2011-07-20 NOTE — Patient Instructions (Addendum)
Take Vitamin A, Vitamin C, and Zinc as we discussed. Stop fish oil and Coenzyme Q10.   Keep November appointment.

## 2011-08-07 ENCOUNTER — Encounter (INDEPENDENT_AMBULATORY_CARE_PROVIDER_SITE_OTHER): Payer: BC Managed Care – PPO | Admitting: General Surgery

## 2011-08-27 ENCOUNTER — Encounter (INDEPENDENT_AMBULATORY_CARE_PROVIDER_SITE_OTHER): Payer: Self-pay | Admitting: General Surgery

## 2011-08-27 ENCOUNTER — Ambulatory Visit (INDEPENDENT_AMBULATORY_CARE_PROVIDER_SITE_OTHER): Payer: BC Managed Care – PPO | Admitting: General Surgery

## 2011-08-27 VITALS — BP 122/88 | HR 84 | Temp 97.7°F | Resp 16 | Ht 66.5 in | Wt 174.2 lb

## 2011-08-27 DIAGNOSIS — Z9889 Other specified postprocedural states: Secondary | ICD-10-CM

## 2011-08-27 NOTE — Patient Instructions (Signed)
Call for any breast problems.

## 2011-08-27 NOTE — Progress Notes (Signed)
Gabriela [name] is here for another postoperative visit. She feels significantly better. She states his swelling is down significantly as well. She's not having any significant pain but is having some itching.  Exam: Left breast-swelling and ecchymosis is substantially improved. Some is still present. Breast symmetry is improved. Incision is clean, dry, intact.  Assessment: Post breast biopsy hematoma-significantly improved. We discussed this still may take many months to completely resolve.  Plan: Gabriela [name] mammogram recommended. Return visit p.r.n.

## 2012-03-12 IMAGING — CR DG CHEST 2V
2 series · 2 of 2 positions shown · non-contrast
Comparison: None.

CLINICAL DATA: Preop.

CHEST - 2 VIEW

[w chest pa]
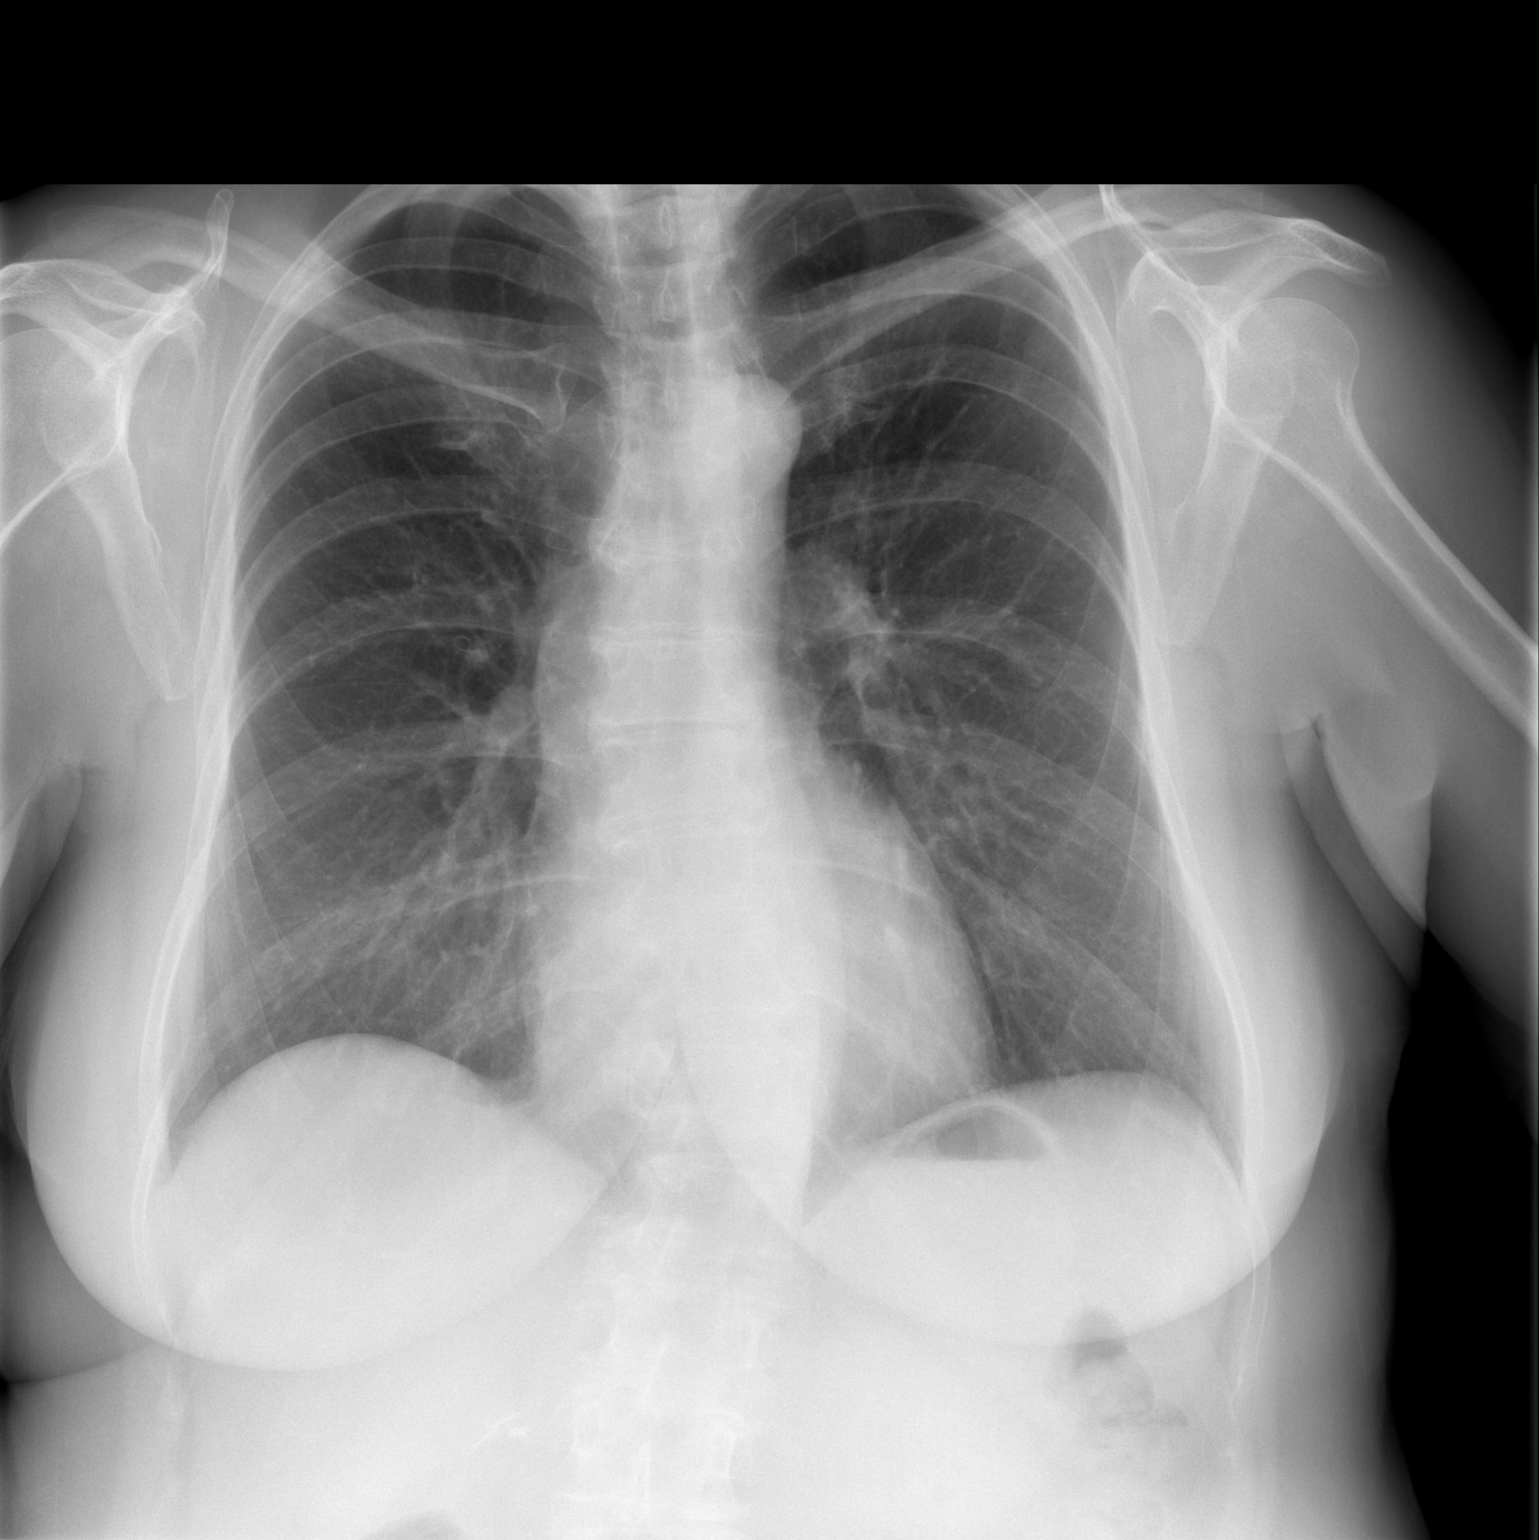

[w chest lat]
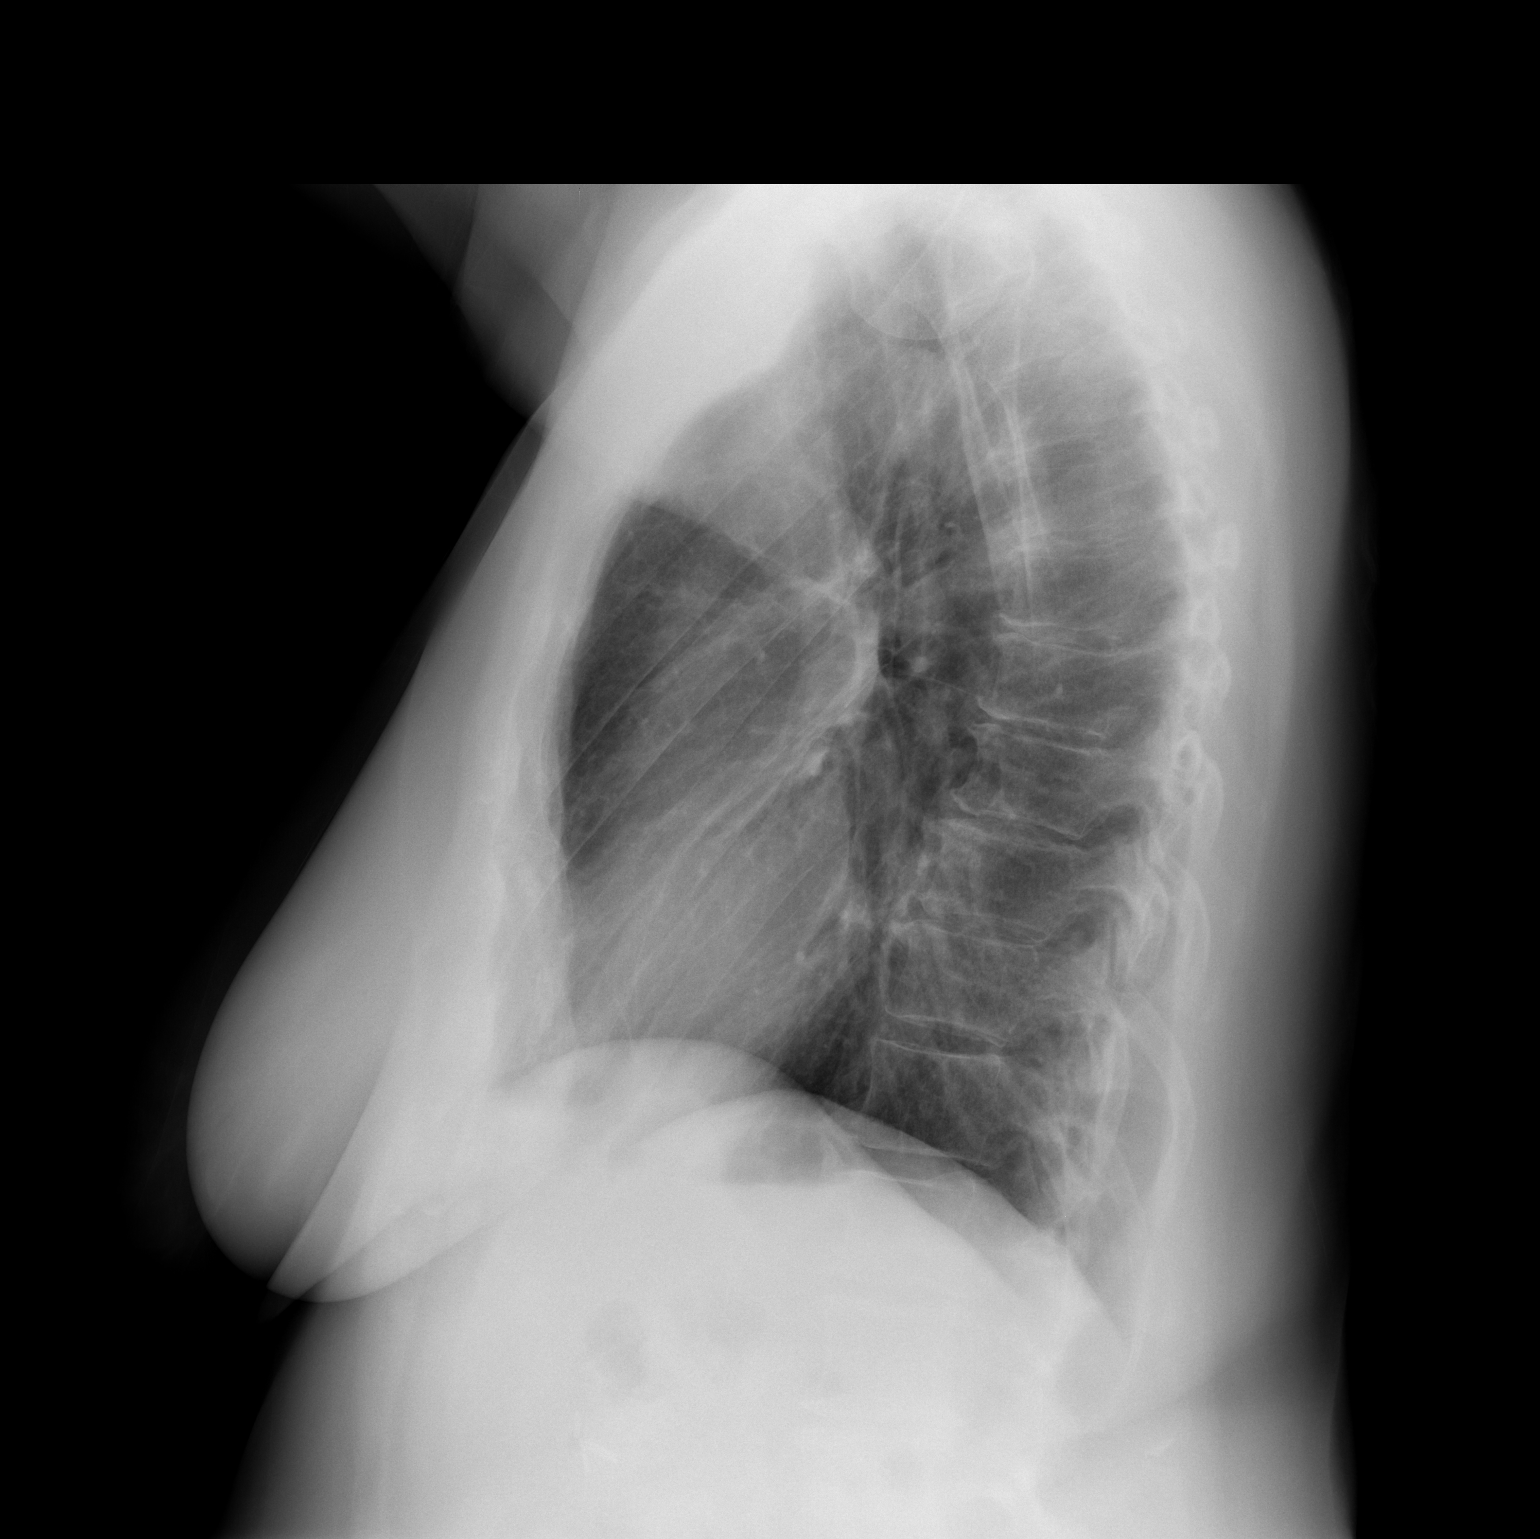

[2 of 2 positions shown; findings below may reference images not displayed]

FINDINGS: Trachea is midline.  Heart size normal.  Lungs are clear.
No pleural fluid.  There may be a mild pectus deformity.
IMPRESSION: No acute findings.

## 2012-09-15 ENCOUNTER — Other Ambulatory Visit: Payer: Self-pay | Admitting: Orthopedic Surgery

## 2012-09-15 MED ORDER — DEXAMETHASONE SODIUM PHOSPHATE 10 MG/ML IJ SOLN: 10.0000 mg | Freq: Once | INTRAMUSCULAR | Status: DC

## 2012-09-15 MED ORDER — BUPIVACAINE LIPOSOME 1.3 % IJ SUSP: 20.0000 mL | Freq: Once | INTRAMUSCULAR | Status: DC

## 2012-09-15 NOTE — Progress Notes (Signed)
Preoperative surgical orders have been place into the Epic hospital system for Gabriela [name] on 09/15/2012, 5:16 PM  by Patrica Duel for surgery on 10/22/2012.  Preop Total Knee orders including Experal, IV Tylenol, and IV Decadron as long as there are no contraindications to the above medications. Avel Peace, PA-C

## 2012-10-10 ENCOUNTER — Encounter (HOSPITAL_COMMUNITY): Payer: Self-pay | Admitting: Pharmacy Technician

## 2012-10-13 ENCOUNTER — Encounter (HOSPITAL_COMMUNITY): Payer: Self-pay

## 2012-10-13 ENCOUNTER — Encounter (HOSPITAL_COMMUNITY)
Admission: RE | Admit: 2012-10-13 | Discharge: 2012-10-13 | Disposition: A | Discharge status: Home / Self Care | Payer: BC Managed Care – PPO | Source: Ambulatory Visit | Attending: Orthopedic Surgery | Admitting: Orthopedic Surgery

## 2012-10-13 DIAGNOSIS — E039 Hypothyroidism, unspecified: Secondary | ICD-10-CM

## 2012-10-13 HISTORY — DX: Unspecified osteoarthritis, unspecified site: M19.90

## 2012-10-13 HISTORY — DX: Other specified postprocedural states: Z98.890

## 2012-10-13 HISTORY — DX: Other specified postprocedural states: R11.2

## 2012-10-13 HISTORY — DX: Hypothyroidism, unspecified: E03.9

## 2012-10-13 LAB — APTT: aPTT: 29 seconds (ref 24–37)

## 2012-10-13 LAB — URINALYSIS, ROUTINE W REFLEX MICROSCOPIC
Bilirubin Urine: NEGATIVE
Hgb urine dipstick: NEGATIVE
Ketones, ur: NEGATIVE mg/dL
Specific Gravity, Urine: 1.023 (ref 1.005–1.030)
pH: 7.5 (ref 5.0–8.0)

## 2012-10-13 LAB — COMPREHENSIVE METABOLIC PANEL
AST: 18 U/L (ref 0–37)
Albumin: 3.8 g/dL (ref 3.5–5.2)
Alkaline Phosphatase: 107 U/L (ref 39–117)
BUN: 13 mg/dL (ref 6–23)
CO2: 27 mEq/L (ref 19–32)
Chloride: 104 mEq/L (ref 96–112)
GFR calc non Af Amer: >90 mL/min (ref >90)
Potassium: 3.9 mEq/L (ref 3.5–5.1)
Total Bilirubin: 0.6 mg/dL (ref 0.3–1.2)

## 2012-10-13 LAB — CBC
HCT: 44 % (ref 36.0–46.0)
MCV: 85.4 fL (ref 78.0–100.0)
RBC: 5.15 MIL/uL — ABNORMAL HIGH (ref 3.87–5.11)
RDW: 12.7 % (ref 11.5–15.5)
WBC: 5.3 10*3/uL (ref 4.0–10.5)

## 2012-10-13 LAB — PROTIME-INR: Prothrombin Time: 12.7 seconds (ref 11.6–15.2)

## 2012-10-13 NOTE — Patient Instructions (Addendum)
20 Gabriela [name]  10/13/2012   Your procedure is scheduled on:  1-29--2014  Report to [facility] at  0900      AM .  Call this number if you have problems the morning of surgery: [phone]  Or Presurgical Testing [phone](Gabriela [name])       Do not eat food:After Midnight.    Take these medicines the morning of surgery with A SIP OF WATER: Armour Thyroid   Do not wear jewelry, make-up or nail polish.  Do not wear lotions, powders, or perfumes. You may wear deodorant.  Do not shave 12 hours prior to first CHG shower(legs and under arms).(face and neck okay.)  Do not bring valuables to the hospital.  Contacts, dentures or bridgework,body piercing,  may not be worn into surgery.  Leave suitcase in the car. After surgery it may be brought to your room.  For patients admitted to the hospital, checkout time is 11:00 AM the day of discharge.   Patients discharged the day of surgery will not be allowed to drive home. Must have responsible person with you x 24 hours once discharged.  Name and phone number of your driver: Gabriela [name], spouse [phone] cell  Special Instructions: CHG Shower Use Special Wash: see special instructions.(avoid face and genitals)   Please read over the following fact sheets that you were given: MRSA Information, Blood Transfusion fact sheet, Incentive Spirometry Instruction.    Failure to follow these instructions may result in Cancellation of your surgery.   Patient signature_______________________________________________________

## 2012-10-16 NOTE — H&P (Signed)
TOTAL KNEE ADMISSION H&P  Patient is being admitted for left total knee arthroplasty.  Subjective:  Chief Complaint:left knee pain.  HPI: Gabriela [name], 57 y.o. female, has a history of pain and functional disability in the left knee due to arthritis and has failed non-surgical conservative treatments for greater than 12 weeks to includecorticosteriod injections, flexibility and strengthening excercises and activity modification.  Onset of symptoms was gradual, starting 2 years ago with gradually worsening course since that time. The patient noted no past surgery on the left knee(s).  Patient currently rates pain in the left knee(s) at 7 out of 10 with activity. Patient has night pain, worsening of pain with activity and weight bearing, pain that interferes with activities of daily living, pain with passive range of motion and crepitus.  Patient has evidence of periarticular osteophytes and joint space narrowing by imaging studies. There is no active infection.  Patient Active Problem List   Diagnosis Date Noted  . Breast hematoma after procedure 07/20/2011  . Fibrocystic change of breast 07/04/2011   Past Medical History  Diagnosis Date  . Thyroid disease   . PONV (postoperative nausea and vomiting)   . Hypothyroidism 10-13-12    tx. supplement  . Arthritis     osteoarthritis-left knee,s/p RTKA    Past Surgical History  Procedure Date  . Cholecystectomy 1999  . Joint replacement 2012    right  . Breast surgery 06/21/11    left breast biopsy and SNL     Current outpatient prescriptions: Acetylcysteine (N-ACETYL-L-CYSTEINE) 600 MG CAPS, Take 1,200 mg by mouth daily. , Disp: , Rfl: ;  Ascorbic Acid (VITAMIN C PO), Take 480 mg by mouth daily. , Disp: , Rfl: ;   cholecalciferol (VITAMIN D) 1000 UNITS tablet, Take 1,000 Units by mouth daily., Disp: , Rfl: ;   DHEA 10 MG CAPS, Take 1 capsule by mouth daily., Disp: , Rfl: ;  Diindolylmethane POWD, by Does not apply route., Disp: , Rfl:    Iodine Strong, Lugols, (IODINE STRONG PO), Take 100 mg by mouth daily., Disp: , Rfl: ;  MAGNESIUM GLYCINATE PLUS PO, Take 200 mg by mouth daily. , Disp: , Rfl: ;   Multiple Vitamin (MULTIVITAMIN WITH MINERALS) TABS, Take 1 tablet by mouth daily., Disp: , Rfl: ;  Omega-3 Fatty Acids (OMEGA 3 PO), Take 820 mg by mouth daily., Disp: , Rfl: ;   OVER THE COUNTER MEDICATION, Take 300 mg by mouth daily. diindolylmethane capsule-estrogen, Disp: , Rfl:  OVER THE COUNTER MEDICATION, Take 1 tablet by mouth daily.  HENAGENICS-IRON SUPPLEMENT, Disp: , Rfl: ;  pyridOXINE (VITAMIN B-6) 50 MG tablet, Take 50 mg by mouth daily., Disp: , Rfl: ;   thyroid (ARMOUR) 120 MG tablet, Take 120 mg by mouth daily before breakfast. , Disp: , Rfl: ;   vitamin A 16109 UNIT capsule, Take 25,000 Units by mouth daily., Disp: , Rfl:   Allergies  Allergen Reactions  . Ampicillin Hives  . Erythromycin Hives  . Sulfa Antibiotics Hives    All over chest    History  Substance Use Topics  . Smoking status: Never Smoker   . Smokeless tobacco: Never Used  . Alcohol Use: No    Family History  Problem Relation Age of Onset  . Cancer Mother     breast  . Cancer Father 18    pancreatic  . Cancer Maternal Aunt     breast  . Cancer Paternal Grandfather     lung  Review of Systems  Constitutional: Negative.   HENT: Negative.  Negative for neck pain.   Eyes: Negative.   Respiratory: Negative.   Cardiovascular: Negative.   Gastrointestinal: Negative.   Genitourinary: Negative.   Musculoskeletal: Positive for joint pain. Negative for myalgias, back pain and falls.       Left knee pain  Skin: Negative.   Neurological: Negative.   Endo/Heme/Allergies: Negative.   Psychiatric/Behavioral: Negative.     Objective:  Physical Exam  Constitutional: She is oriented to person, place, and time. She appears well-developed and well-nourished. No distress.  HENT:  Head: Normocephalic and atraumatic.  Right Ear: External  ear normal.  Left Ear: External ear normal.  Nose: Nose normal.  Mouth/Throat: Oropharynx is clear and moist.  Eyes: Conjunctivae normal and EOM are normal.  Neck: Normal range of motion. Neck supple. No tracheal deviation present. No thyromegaly present.  Cardiovascular: Normal rate, regular rhythm, normal heart sounds and intact distal pulses.   No murmur heard. Respiratory: Effort normal and breath sounds normal. No respiratory distress. She has no wheezes. She exhibits no tenderness.  GI: Soft. Bowel sounds are normal. She exhibits no distension and no mass. There is no tenderness.  Musculoskeletal:       Right hip: Normal.       Left hip: Normal.       Right lower leg: She exhibits no tenderness and no swelling.       Left lower leg: She exhibits no tenderness and no swelling.       Legs: Lymphadenopathy:    She has no cervical adenopathy.  Neurological: She is alert and oriented to person, place, and time. She has normal strength and normal reflexes. No sensory deficit.  Skin: No rash noted. She is not diaphoretic. No erythema.  Psychiatric: She has a normal mood and affect. Her behavior is normal.    Vitals Weight: 172 lb Height: 66.25 in Body Surface Area: 1.91 m Body Mass Index: 27.55 kg/m Pulse: 72 (Regular) Resp.: 16 (Unlabored) BP: 122/76 (Sitting, Left Arm, Standard)  Estimated Body mass index is 27.70 kg/(m^2) as calculated from the following:   Height as of 08/27/11: 5' 6.5"(1.689 m).   Weight as of 08/27/11: 174 lb 3.2 oz(79.017 kg).   Imaging Review Plain radiographs demonstrate severe degenerative joint disease of the left knee(s). The overall alignment ismild varus. The bone quality appears to be good for age and reported activity level.  Assessment/Plan:  End stage arthritis, left knee   The patient history, physical examination, clinical judgment of the provider and imaging studies are consistent with end stage degenerative joint disease of  the left knee(s) and total knee arthroplasty is deemed medically necessary. The treatment options including medical management, injection therapy arthroscopy and arthroplasty were discussed at length. The risks and benefits of total knee arthroplasty were presented and reviewed. The risks due to aseptic loosening, infection, stiffness, patella tracking problems, thromboembolic complications and other imponderables were discussed. The patient acknowledged the explanation, agreed to proceed with the plan and consent was signed. Patient is being admitted for inpatient treatment for surgery, pain control, PT, OT, prophylactic antibiotics, VTE prophylaxis, progressive ambulation and ADL's and discharge planning. The patient is planning to be discharged home with home health services    [place], [place]

## 2012-10-21 MED ORDER — VANCOMYCIN HCL 10 G IV SOLR
1500.0000 mg | INTRAVENOUS | Status: DC
  Filled 2012-10-21: qty 1500

## 2012-10-22 ENCOUNTER — Encounter (HOSPITAL_COMMUNITY): Payer: Self-pay | Admitting: *Deleted

## 2012-10-22 ENCOUNTER — Inpatient Hospital Stay (HOSPITAL_COMMUNITY)
Admission: RE | Admit: 2012-10-22 | Discharge: 2012-10-24 | DRG: 209 | Disposition: A | Discharge status: Home Health | Payer: BC Managed Care – PPO | Source: Ambulatory Visit | Attending: Orthopedic Surgery | Admitting: Orthopedic Surgery

## 2012-10-22 ENCOUNTER — Inpatient Hospital Stay (HOSPITAL_COMMUNITY): Payer: BC Managed Care – PPO | Admitting: Anesthesiology

## 2012-10-22 ENCOUNTER — Encounter (HOSPITAL_COMMUNITY): Payer: Self-pay | Admitting: Anesthesiology

## 2012-10-22 ENCOUNTER — Encounter (HOSPITAL_COMMUNITY)
Admission: RE | Disposition: A | Discharge status: Home Health | Payer: Self-pay | Source: Ambulatory Visit | Attending: Orthopedic Surgery

## 2012-10-22 DIAGNOSIS — N6019 Diffuse cystic mastopathy of unspecified breast: Secondary | ICD-10-CM | POA: Diagnosis present

## 2012-10-22 DIAGNOSIS — D62 Acute posthemorrhagic anemia: Secondary | ICD-10-CM | POA: Diagnosis not present

## 2012-10-22 DIAGNOSIS — M179 Osteoarthritis of knee, unspecified: Secondary | ICD-10-CM | POA: Diagnosis present

## 2012-10-22 DIAGNOSIS — Z881 Allergy status to other antibiotic agents status: Secondary | ICD-10-CM

## 2012-10-22 DIAGNOSIS — Z79899 Other long term (current) drug therapy: Secondary | ICD-10-CM

## 2012-10-22 DIAGNOSIS — Z96659 Presence of unspecified artificial knee joint: Secondary | ICD-10-CM

## 2012-10-22 DIAGNOSIS — E039 Hypothyroidism, unspecified: Secondary | ICD-10-CM | POA: Diagnosis present

## 2012-10-22 DIAGNOSIS — M1710 Unilateral primary osteoarthritis, unspecified knee: Principal | ICD-10-CM | POA: Diagnosis present

## 2012-10-22 DIAGNOSIS — Z9089 Acquired absence of other organs: Secondary | ICD-10-CM

## 2012-10-22 DIAGNOSIS — Z882 Allergy status to sulfonamides status: Secondary | ICD-10-CM

## 2012-10-22 HISTORY — PX: TOTAL KNEE ARTHROPLASTY: SHX125

## 2012-10-22 HISTORY — PX: JOINT REPLACEMENT: SHX530

## 2012-10-22 LAB — TYPE AND SCREEN: Antibody Screen: NEGATIVE

## 2012-10-22 SURGERY — ARTHROPLASTY, KNEE, TOTAL
Anesthesia: Spinal | Site: Knee | Laterality: Left | Wound class: Clean

## 2012-10-22 MED ORDER — PROMETHAZINE HCL 25 MG/ML IJ SOLN: 6.2500 mg | INTRAMUSCULAR | Status: DC | PRN

## 2012-10-22 MED ORDER — SODIUM CHLORIDE 0.9 % IJ SOLN
INTRAMUSCULAR | Status: DC | PRN
  Administered 2012-10-22: 50 mL via INTRAVENOUS

## 2012-10-22 MED ORDER — ONDANSETRON HCL 4 MG PO TABS
4.0000 mg | ORAL_TABLET | Freq: Four times a day (QID) | ORAL | Status: DC | PRN
  Administered 2012-10-23: 4 mg via ORAL
  Filled 2012-10-22: qty 1

## 2012-10-22 MED ORDER — THYROID 120 MG PO TABS
120.0000 mg | ORAL_TABLET | Freq: Every day | ORAL | Status: DC
  Administered 2012-10-23 – 2012-10-24 (×2): 120 mg via ORAL
  Filled 2012-10-22 (×3): qty 1

## 2012-10-22 MED ORDER — VANCOMYCIN HCL IN DEXTROSE 1-5 GM/200ML-% IV SOLN
1000.0000 mg | INTRAVENOUS | Status: AC
  Administered 2012-10-22: 1000 mg via INTRAVENOUS

## 2012-10-22 MED ORDER — VANCOMYCIN HCL IN DEXTROSE 1-5 GM/200ML-% IV SOLN
1000.0000 mg | Freq: Two times a day (BID) | INTRAVENOUS | Status: AC
  Administered 2012-10-22: 1000 mg via INTRAVENOUS
  Filled 2012-10-22: qty 200

## 2012-10-22 MED ORDER — LACTATED RINGERS IV SOLN
INTRAVENOUS | Status: DC
  Administered 2012-10-22: 1000 mL via INTRAVENOUS

## 2012-10-22 MED ORDER — TRAMADOL HCL 50 MG PO TABS: 50.0000 mg | ORAL_TABLET | Freq: Four times a day (QID) | ORAL | Status: DC | PRN

## 2012-10-22 MED ORDER — KCL IN DEXTROSE-NACL 20-5-0.9 MEQ/L-%-% IV SOLN
INTRAVENOUS | Status: DC
  Administered 2012-10-22 – 2012-10-23 (×2): via INTRAVENOUS
  Filled 2012-10-22 (×2): qty 1000

## 2012-10-22 MED ORDER — BISACODYL 10 MG RE SUPP: 10.0000 mg | Freq: Every day | RECTAL | Status: DC | PRN

## 2012-10-22 MED ORDER — LACTATED RINGERS IV SOLN
INTRAVENOUS | Status: DC | PRN
  Administered 2012-10-22 (×2): via INTRAVENOUS

## 2012-10-22 MED ORDER — METOCLOPRAMIDE HCL 10 MG PO TABS: 5.0000 mg | ORAL_TABLET | Freq: Three times a day (TID) | ORAL | Status: DC | PRN

## 2012-10-22 MED ORDER — ONDANSETRON HCL 4 MG/2ML IJ SOLN
INTRAMUSCULAR | Status: DC | PRN
  Administered 2012-10-22 (×2): 2 mg via INTRAVENOUS

## 2012-10-22 MED ORDER — HYDROMORPHONE HCL PF 1 MG/ML IJ SOLN
0.2500 mg | INTRAMUSCULAR | Status: DC | PRN
  Administered 2012-10-22 (×4): 0.5 mg via INTRAVENOUS

## 2012-10-22 MED ORDER — DEXAMETHASONE SODIUM PHOSPHATE 10 MG/ML IJ SOLN: 10.0000 mg | Freq: Once | INTRAMUSCULAR | Status: AC

## 2012-10-22 MED ORDER — MORPHINE SULFATE 2 MG/ML IJ SOLN
1.0000 mg | INTRAMUSCULAR | Status: DC | PRN
  Administered 2012-10-22: 1 mg via INTRAVENOUS
  Filled 2012-10-22: qty 1

## 2012-10-22 MED ORDER — SALINE FLUSH 0.9 % IV SOLN: INTRAVENOUS | Status: DC | PRN

## 2012-10-22 MED ORDER — ACETAMINOPHEN 325 MG PO TABS: 650.0000 mg | ORAL_TABLET | Freq: Four times a day (QID) | ORAL | Status: DC | PRN

## 2012-10-22 MED ORDER — ACETAMINOPHEN 10 MG/ML IV SOLN
1000.0000 mg | Freq: Four times a day (QID) | INTRAVENOUS | Status: AC
  Administered 2012-10-22 – 2012-10-23 (×3): 1000 mg via INTRAVENOUS
  Filled 2012-10-22 (×6): qty 100

## 2012-10-22 MED ORDER — CHLORHEXIDINE GLUCONATE 4 % EX LIQD: 60.0000 mL | Freq: Once | CUTANEOUS | Status: DC

## 2012-10-22 MED ORDER — PROPOFOL 10 MG/ML IV EMUL
INTRAVENOUS | Status: DC | PRN
  Administered 2012-10-22: 100 ug/kg/min via INTRAVENOUS

## 2012-10-22 MED ORDER — POLYETHYLENE GLYCOL 3350 17 G PO PACK: 17.0000 g | PACK | Freq: Every day | ORAL | Status: DC | PRN

## 2012-10-22 MED ORDER — FENTANYL CITRATE 0.05 MG/ML IJ SOLN
INTRAMUSCULAR | Status: DC | PRN
  Administered 2012-10-22 (×2): 25 ug via INTRAVENOUS

## 2012-10-22 MED ORDER — 0.9 % SODIUM CHLORIDE (POUR BTL) OPTIME
TOPICAL | Status: DC | PRN
  Administered 2012-10-22: 1000 mL

## 2012-10-22 MED ORDER — ACETAMINOPHEN 650 MG RE SUPP: 650.0000 mg | Freq: Four times a day (QID) | RECTAL | Status: DC | PRN

## 2012-10-22 MED ORDER — FLEET ENEMA 7-19 GM/118ML RE ENEM: 1.0000 | ENEMA | Freq: Once | RECTAL | Status: AC | PRN

## 2012-10-22 MED ORDER — METHOCARBAMOL 500 MG PO TABS: 500.0000 mg | ORAL_TABLET | Freq: Four times a day (QID) | ORAL | Status: DC | PRN

## 2012-10-22 MED ORDER — PHENOL 1.4 % MT LIQD
1.0000 | OROMUCOSAL | Status: DC | PRN
  Filled 2012-10-22: qty 177

## 2012-10-22 MED ORDER — METHOCARBAMOL 100 MG/ML IJ SOLN
500.0000 mg | Freq: Four times a day (QID) | INTRAVENOUS | Status: DC | PRN
  Administered 2012-10-22: 500 mg via INTRAVENOUS
  Filled 2012-10-22 (×2): qty 5

## 2012-10-22 MED ORDER — EPHEDRINE SULFATE 50 MG/ML IJ SOLN
INTRAMUSCULAR | Status: DC | PRN
  Administered 2012-10-22 (×2): 5 mg via INTRAVENOUS
  Administered 2012-10-22: 10 mg via INTRAVENOUS
  Administered 2012-10-22: 5 mg via INTRAVENOUS

## 2012-10-22 MED ORDER — MORPHINE SULFATE 2 MG/ML IJ SOLN
1.0000 mg | INTRAMUSCULAR | Status: DC | PRN
  Administered 2012-10-22 (×2): 2 mg via INTRAVENOUS
  Filled 2012-10-22 (×2): qty 1

## 2012-10-22 MED ORDER — DIPHENHYDRAMINE HCL 12.5 MG/5ML PO ELIX: 12.5000 mg | ORAL_SOLUTION | ORAL | Status: DC | PRN

## 2012-10-22 MED ORDER — METOCLOPRAMIDE HCL 5 MG/ML IJ SOLN
5.0000 mg | Freq: Three times a day (TID) | INTRAMUSCULAR | Status: DC | PRN
  Administered 2012-10-22: 10 mg via INTRAVENOUS
  Filled 2012-10-22: qty 2

## 2012-10-22 MED ORDER — MEPERIDINE HCL 50 MG/ML IJ SOLN: 6.2500 mg | INTRAMUSCULAR | Status: DC | PRN

## 2012-10-22 MED ORDER — RIVAROXABAN 10 MG PO TABS
10.0000 mg | ORAL_TABLET | Freq: Every day | ORAL | Status: DC
  Administered 2012-10-23 – 2012-10-24 (×2): 10 mg via ORAL
  Filled 2012-10-22 (×3): qty 1

## 2012-10-22 MED ORDER — BUPIVACAINE LIPOSOME 1.3 % IJ SUSP
20.0000 mL | Freq: Once | INTRAMUSCULAR | Status: AC
  Administered 2012-10-22: 20 mL
  Filled 2012-10-22: qty 20

## 2012-10-22 MED ORDER — OXYCODONE HCL 5 MG PO TABS
5.0000 mg | ORAL_TABLET | ORAL | Status: DC | PRN
  Administered 2012-10-22 – 2012-10-24 (×11): 10 mg via ORAL
  Filled 2012-10-22 (×14): qty 2

## 2012-10-22 MED ORDER — ACETAMINOPHEN 10 MG/ML IV SOLN
1000.0000 mg | Freq: Once | INTRAVENOUS | Status: AC
  Administered 2012-10-22: 1000 mg via INTRAVENOUS

## 2012-10-22 MED ORDER — BUPIVACAINE IN DEXTROSE 0.75-8.25 % IT SOLN
INTRATHECAL | Status: DC | PRN
  Administered 2012-10-22: 1.8 mL via INTRATHECAL

## 2012-10-22 MED ORDER — ONDANSETRON HCL 4 MG/2ML IJ SOLN
4.0000 mg | Freq: Four times a day (QID) | INTRAMUSCULAR | Status: DC | PRN
  Administered 2012-10-22: 4 mg via INTRAVENOUS
  Filled 2012-10-22: qty 2

## 2012-10-22 MED ORDER — MIDAZOLAM HCL 5 MG/5ML IJ SOLN
INTRAMUSCULAR | Status: DC | PRN
  Administered 2012-10-22 (×2): 0.5 mg via INTRAVENOUS

## 2012-10-22 MED ORDER — SODIUM CHLORIDE 0.9 % IV SOLN: INTRAVENOUS | Status: DC

## 2012-10-22 MED ORDER — DOCUSATE SODIUM 100 MG PO CAPS
100.0000 mg | ORAL_CAPSULE | Freq: Two times a day (BID) | ORAL | Status: DC
  Administered 2012-10-22 – 2012-10-24 (×4): 100 mg via ORAL

## 2012-10-22 MED ORDER — MENTHOL 3 MG MT LOZG
1.0000 | LOZENGE | OROMUCOSAL | Status: DC | PRN
  Filled 2012-10-22: qty 9

## 2012-10-22 MED ORDER — DEXAMETHASONE 6 MG PO TABS
10.0000 mg | ORAL_TABLET | Freq: Once | ORAL | Status: AC
  Administered 2012-10-23: 10 mg via ORAL
  Filled 2012-10-22 (×2): qty 1

## 2012-10-22 SURGICAL SUPPLY — 56 items
BAG SPEC THK2 15X12 ZIP CLS (MISCELLANEOUS) ×1
BAG ZIPLOCK 12X15 (MISCELLANEOUS) ×2 IMPLANT
BANDAGE ELASTIC 6 VELCRO ST LF (GAUZE/BANDAGES/DRESSINGS) ×2 IMPLANT
BANDAGE ESMARK 6X9 LF (GAUZE/BANDAGES/DRESSINGS) ×1 IMPLANT
BLADE SAG 18X100X1.27 (BLADE) ×2 IMPLANT
BLADE SAW SGTL 11.0X1.19X90.0M (BLADE) ×2 IMPLANT
BNDG CMPR 9X6 STRL LF SNTH (GAUZE/BANDAGES/DRESSINGS) ×1
BNDG ESMARK 6X9 LF (GAUZE/BANDAGES/DRESSINGS) ×2
BOWL SMART MIX CTS (DISPOSABLE) ×2 IMPLANT
CATH KIT ON-Q SILVERSOAK 5 (CATHETERS) ×1 IMPLANT
CATH KIT ON-Q SILVERSOAK 5IN (CATHETERS) IMPLANT
CEMENT HV SMART SET (Cement) ×3 IMPLANT
CLOTH BEACON ORANGE TIMEOUT ST (SAFETY) ×2 IMPLANT
CLSR STERI-STRIP ANTIMIC 1/2X4 (GAUZE/BANDAGES/DRESSINGS) ×1 IMPLANT
CUFF TOURN SGL QUICK 34 (TOURNIQUET CUFF) ×2
CUFF TRNQT CYL 34X4X40X1 (TOURNIQUET CUFF) ×1 IMPLANT
DRAPE EXTREMITY T 121X128X90 (DRAPE) ×2 IMPLANT
DRAPE POUCH INSTRU U-SHP 10X18 (DRAPES) ×2 IMPLANT
DRAPE U-SHAPE 47X51 STRL (DRAPES) ×2 IMPLANT
DRSG ADAPTIC 3X8 NADH LF (GAUZE/BANDAGES/DRESSINGS) ×2 IMPLANT
DURAPREP 26ML APPLICATOR (WOUND CARE) ×2 IMPLANT
ELECT REM PT RETURN 9FT ADLT (ELECTROSURGICAL) ×2
ELECTRODE REM PT RTRN 9FT ADLT (ELECTROSURGICAL) ×1 IMPLANT
EVACUATOR 1/8 PVC DRAIN (DRAIN) ×2 IMPLANT
FACESHIELD LNG OPTICON STERILE (SAFETY) ×10 IMPLANT
GLOVE BIO SURGEON STRL SZ8 (GLOVE) ×2 IMPLANT
GLOVE BIOGEL PI IND STRL 8 (GLOVE) ×2 IMPLANT
GLOVE BIOGEL PI INDICATOR 8 (GLOVE) ×2
GLOVE ECLIPSE 8.0 STRL XLNG CF (GLOVE) ×2 IMPLANT
GLOVE SURG SS PI 6.5 STRL IVOR (GLOVE) ×4 IMPLANT
GOWN STRL NON-REIN LRG LVL3 (GOWN DISPOSABLE) ×4 IMPLANT
GOWN STRL REIN XL XLG (GOWN DISPOSABLE) ×6 IMPLANT
HANDPIECE INTERPULSE COAX TIP (DISPOSABLE) ×2
IMMOBILIZER KNEE 20 (SOFTGOODS) ×2
IMMOBILIZER KNEE 20 THIGH 36 (SOFTGOODS) ×1 IMPLANT
KIT BASIN OR (CUSTOM PROCEDURE TRAY) ×2 IMPLANT
MANIFOLD NEPTUNE II (INSTRUMENTS) ×2 IMPLANT
NEEDLE 27GAX1X1/2 (NEEDLE) ×2 IMPLANT
NS IRRIG 1000ML POUR BTL (IV SOLUTION) ×2 IMPLANT
PACK TOTAL JOINT (CUSTOM PROCEDURE TRAY) ×2 IMPLANT
PAD ABD 7.5X8 STRL (GAUZE/BANDAGES/DRESSINGS) ×2 IMPLANT
PADDING CAST COTTON 6X4 STRL (CAST SUPPLIES) ×4 IMPLANT
POSITIONER SURGICAL ARM (MISCELLANEOUS) ×2 IMPLANT
SET HNDPC FAN SPRY TIP SCT (DISPOSABLE) ×1 IMPLANT
SPONGE GAUZE 4X4 12PLY (GAUZE/BANDAGES/DRESSINGS) ×2 IMPLANT
STRIP CLOSURE SKIN 1/2X4 (GAUZE/BANDAGES/DRESSINGS) ×4 IMPLANT
SUCTION FRAZIER 12FR DISP (SUCTIONS) ×2 IMPLANT
SUT MNCRL AB 4-0 PS2 18 (SUTURE) ×2 IMPLANT
SUT VIC AB 2-0 CT1 27 (SUTURE) ×6
SUT VIC AB 2-0 CT1 TAPERPNT 27 (SUTURE) ×3 IMPLANT
SUT VLOC 180 0 24IN GS25 (SUTURE) ×2 IMPLANT
SYR 50ML LL SCALE MARK (SYRINGE) ×2 IMPLANT
TOWEL OR 17X26 10 PK STRL BLUE (TOWEL DISPOSABLE) ×4 IMPLANT
TRAY FOLEY CATH 14FRSI W/METER (CATHETERS) ×2 IMPLANT
WATER STERILE IRR 1500ML POUR (IV SOLUTION) ×2 IMPLANT
WRAP KNEE MAXI GEL POST OP (GAUZE/BANDAGES/DRESSINGS) ×4 IMPLANT

## 2012-10-22 NOTE — Transfer of Care (Signed)
Immediate Anesthesia Transfer of Care Note  Patient: Gabriela [name]  Procedure(s) Performed: Procedure(s) (LRB) with comments: TOTAL KNEE ARTHROPLASTY (Left)  Patient Location: PACU  Anesthesia Type:Regional  Level of Consciousness: awake, alert , oriented and patient cooperative  Airway & Oxygen Therapy: Patient Spontanous Breathing and Patient connected to face mask oxygen  Post-op Assessment: Report given to PACU RN and Post -op Vital signs reviewed and stable  Post vital signs: Reviewed and stable  Complications: No apparent anesthesia complications

## 2012-10-22 NOTE — Interval H&P Note (Signed)
History and Physical Interval Note:  10/22/2012 10:49 AM  Gabriela [name]  has presented today for surgery, with the diagnosis of OA OF THE LEFT KNEE  The various methods of treatment have been discussed with the patient and family. After consideration of risks, benefits and other options for treatment, the patient has consented to  Procedure(s) (LRB) with comments: TOTAL KNEE ARTHROPLASTY (Left) as a surgical intervention .  The patient's history has been reviewed, patient examined, no change in status, stable for surgery.  I have reviewed the patient's chart and labs.  Questions were answered to the patient's satisfaction.     Loanne Drilling

## 2012-10-22 NOTE — Anesthesia Procedure Notes (Signed)
Spinal Patient location during procedure: OR Staffing Anesthesiologist: Alayha Babineaux Performed by: anesthesiologist  Preanesthetic Checklist Completed: patient identified, site marked, surgical consent, pre-op evaluation, timeout performed, IV checked, risks and benefits discussed and monitors and equipment checked Spinal Block Patient position: sitting Prep: Betadine Patient monitoring: heart rate, continuous pulse ox and blood pressure Approach: right paramedian Location: L4-5 Injection technique: single-shot Needle Needle type: Spinocan  Needle gauge: 22 G Needle length: 9 cm Additional Notes Expiration date of kit checked and confirmed. Patient tolerated procedure well, without complications.     

## 2012-10-22 NOTE — Op Note (Signed)
Pre-operative diagnosis- Osteoarthritis  Left knee(s)  Post-operative diagnosis- Osteoarthritis Left knee(s)  Procedure-  Left  Total Knee Arthroplasty  Surgeon- Gus Rankin. Kynadi Dragos, MD  Assistant- Avel Peace, PA-C   Anesthesia-  Spinal EBL-* No blood loss amount entered *  Drains Hemovac  Tourniquet time-  Total Tourniquet Time Documented: Thigh (Left) - 28 minutes   Complications- None  Condition-PACU - hemodynamically stable.   Brief Clinical Note  Gabriela [name] is a 57 y.o. year old female with end stage OA of her left knee with progressively worsening pain and dysfunction. She has constant pain, with activity and at rest and significant functional deficits with difficulties even with ADLs. She has had extensive non-op management including analgesics, injections of cortisone and viscosupplements, and home exercise program, but remains in significant pain with significant dysfunction. Radiographs show bone on bone arthritis medial and patellofemoral with varus deformity. She presents now for left Total Knee Arthroplasty.    Procedure in detail---   The patient is brought into the operating room and positioned supine on the operating table. After successful administration of  Spinal,   a tourniquet is placed high on the  Left thigh(s) and the lower extremity is prepped and draped in the usual sterile fashion. Time out is performed by the operating team and then the  Left lower extremity is wrapped in Esmarch, knee flexed and the tourniquet inflated to 300 mmHg.       A midline incision is made with a ten blade through the subcutaneous tissue to the level of the extensor mechanism. A fresh blade is used to make a medial parapatellar arthrotomy. Soft tissue over the proximal medial tibia is subperiosteally elevated to the joint line with a knife and into the semimembranosus bursa with a Cobb elevator. Soft tissue over the proximal lateral tibia is elevated with attention being paid to  avoiding the patellar tendon on the tibial tubercle. The patella is everted, knee flexed 90 degrees and the ACL and PCL are removed. Findings are bone on bone medial and patellofemoral with large medial and patellar osteophytes.        The drill is used to create a starting hole in the distal femur and the canal is thoroughly irrigated with sterile saline to remove the fatty contents. The 5 degree Left  valgus alignment guide is placed into the femoral canal and the distal femoral cutting block is pinned to remove 10 mm off the distal femur. Resection is made with an oscillating saw.      The tibia is subluxed forward and the menisci are removed. The extramedullary alignment guide is placed referencing proximally at the medial aspect of the tibial tubercle and distally along the second metatarsal axis and tibial crest. The block is pinned to remove 2mm off the more deficient medial  side. Resection is made with an oscillating saw. Size 3is the most appropriate size for the tibia and the proximal tibia is prepared with the modular drill and keel punch for that size.      The femoral sizing guide is placed and size 3 is most appropriate. Rotation is marked off the epicondylar axis and confirmed by creating a rectangular flexion gap at 90 degrees. The size 3 cutting block is pinned in this rotation and the anterior, posterior and chamfer cuts are made with the oscillating saw. The intercondylar block is then placed and that cut is made.      Trial size 3 tibial component, trial size 3 posterior stabilized  femur and a 12.5  mm posterior stabilized rotating platform insert trial is placed. Full extension is achieved with excellent varus/valgus and anterior/posterior balance throughout full range of motion. The patella is everted and thickness measured to be 2  mm. Free hand resection is taken to 12 mm, a 38 template is placed, lug holes are drilled, trial patella is placed, and it tracks normally. Osteophytes are  removed off the posterior femur with the trial in place. All trials are removed and the cut bone surfaces prepared with pulsatile lavage. Cement is mixed and once ready for implantation, the size 3 tibial implant, size  3 posterior stabilized femoral component, and the size 38 patella are cemented in place and the patella is held with the clamp. The trial insert is placed and the knee held in full extension. The Exparel (20 ml mixed with 50 ml saline) is injected into the extensor mechanism, posterior capsule, medial and lateral gutters and subcutaneous tissues.  All extruded cement is removed and once the cement is hard the permanent 12.5 mm posterior stabilized rotating platform insert is placed into the tibial tray.      The wound is copiously irrigated with saline solution and the extensor mechanism closed over a hemovac drain with #1 PDS suture. The tourniquet is released for a total tourniquet time of 28  minutes. Flexion against gravity is 140 degrees and the patella tracks normally. Subcutaneous tissue is closed with 2.0 vicryl and subcuticular with running 4.0 Monocryl. The incision is cleaned and dried and steri-strips and a bulky sterile dressing are applied. The limb is placed into a knee immobilizer and the patient is awakened and transported to recovery in stable condition.      Please note that a surgical assistant was a medical necessity for this procedure in order to perform it in a safe and expeditious manner. Surgical assistant was necessary to retract the ligaments and vital neurovascular structures to prevent injury to them and also necessary for proper positioning of the limb to allow for anatomic placement of the prosthesis.   Gus Rankin Alleene Stoy, MD    10/22/2012, 12:07 PM

## 2012-10-22 NOTE — Anesthesia Postprocedure Evaluation (Signed)
  Anesthesia Post-op Note  Patient: Gabriela [name]  Procedure(s) Performed: Procedure(s) (LRB): TOTAL KNEE ARTHROPLASTY (Left)  Patient Location: PACU  Anesthesia Type: Spinal  Level of Consciousness: awake and alert   Airway and Oxygen Therapy: Patient Spontanous Breathing  Post-op Pain: mild  Post-op Assessment: Post-op Vital signs reviewed, Patient's Cardiovascular Status Stable, Respiratory Function Stable, Patent Airway and No signs of Nausea or vomiting  Last Vitals:  Filed Vitals:   10/22/12 1528  BP:   Pulse:   Temp:   Resp: 14    Post-op Vital Signs: stable   Complications: No apparent anesthesia complications

## 2012-10-22 NOTE — Anesthesia Preprocedure Evaluation (Signed)
Anesthesia Evaluation  Patient identified by MRN, date of birth, ID band Patient awake    Reviewed: Allergy & Precautions, H&P , NPO status , Patient's Chart, lab work & pertinent test results  History of Anesthesia Complications (+) PONV  Airway Mallampati: II TM Distance: >3 FB Neck ROM: Full    Dental No notable dental hx.    Pulmonary neg pulmonary ROS,  breath sounds clear to auscultation  Pulmonary exam normal       Cardiovascular negative cardio ROS  Rhythm:Regular Rate:Normal     Neuro/Psych negative neurological ROS  negative psych ROS   GI/Hepatic negative GI ROS, Neg liver ROS,   Endo/Other  negative endocrine ROSHypothyroidism   Renal/GU negative Renal ROS  negative genitourinary   Musculoskeletal negative musculoskeletal ROS (+)   Abdominal   Peds negative pediatric ROS (+)  Hematology negative hematology ROS (+)   Anesthesia Other Findings   Reproductive/Obstetrics negative OB ROS                           Anesthesia Physical Anesthesia Plan  ASA: II  Anesthesia Plan: Spinal   Post-op Pain Management:    Induction:   Airway Management Planned: Simple Face Mask  Additional Equipment:   Intra-op Plan:   Post-operative Plan:   Informed Consent: I have reviewed the patients History and Physical, chart, labs and discussed the procedure including the risks, benefits and alternatives for the proposed anesthesia with the patient or authorized representative who has indicated his/her understanding and acceptance.   Dental advisory given  Plan Discussed with: CRNA  Anesthesia Plan Comments:         Anesthesia Quick Evaluation

## 2012-10-23 ENCOUNTER — Encounter (HOSPITAL_COMMUNITY): Payer: Self-pay | Admitting: Orthopedic Surgery

## 2012-10-23 DIAGNOSIS — D62 Acute posthemorrhagic anemia: Secondary | ICD-10-CM | POA: Diagnosis not present

## 2012-10-23 LAB — BASIC METABOLIC PANEL
BUN: 11 mg/dL (ref 6–23)
Creatinine, Ser: 0.58 mg/dL (ref 0.50–1.10)
GFR calc Af Amer: >90 mL/min (ref >90)
GFR calc non Af Amer: >90 mL/min (ref >90)

## 2012-10-23 LAB — CBC
HCT: 35.8 % — ABNORMAL LOW (ref 36.0–46.0)
MCHC: 33 g/dL (ref 30.0–36.0)
MCV: 85.2 fL (ref 78.0–100.0)
RDW: 12.8 % (ref 11.5–15.5)

## 2012-10-23 MED ORDER — SODIUM CHLORIDE 0.9 % IV BOLUS (SEPSIS)
250.0000 mL | Freq: Once | INTRAVENOUS | Status: AC
  Administered 2012-10-23: 250 mL via INTRAVENOUS

## 2012-10-23 MED ORDER — CYCLOBENZAPRINE HCL 10 MG PO TABS
10.0000 mg | ORAL_TABLET | Freq: Three times a day (TID) | ORAL | Status: DC | PRN
  Administered 2012-10-23: 10 mg via ORAL
  Filled 2012-10-23: qty 1

## 2012-10-23 MED ORDER — SODIUM CHLORIDE 0.9 % IV SOLN
INTRAVENOUS | Status: DC
  Administered 2012-10-23 (×2): via INTRAVENOUS

## 2012-10-23 NOTE — Progress Notes (Signed)
Utilization review completed.  

## 2012-10-23 NOTE — Progress Notes (Signed)
OT Cancellation Note  Patient Details Name: Gabriela [name] MRN: [id number] DOB: [birth year]   Cancelled Treatment:    Reason Eval/Treat Not Completed: Other (comment) (OT Screen- pt had other knee replaced. Has all DME and prn A) Pt presents with no OT needs at this time.  Saron Vanorman A OTR/L [phone] 10/23/2012, 2:16 PM

## 2012-10-23 NOTE — Progress Notes (Signed)
Physical Therapy Treatment Patient Details Name: Gabriela [name] MRN: [id number] DOB: 1955-10-17 Today's Date: 10/23/2012 Time: 4098-1191 PT Time Calculation (min): 31 min  PT Assessment / Plan / Recommendation Comments on Treatment Session  Pt, tolerated ambulation this PN x 60 ft. Less nausea Pt. may DC tomorrow.     Follow Up Recommendations  Home health PT     Does the patient have the potential to tolerate intense rehabilitation     Barriers to Discharge        Equipment Recommendations  None recommended by PT    Recommendations for Other Services    Frequency 7X/week   Plan      Precautions / Restrictions Precautions Precautions: Knee Required Braces or Orthoses: Knee Immobilizer - Left Knee Immobilizer - Right: Discontinue once straight leg raise with < 10 degree lag Knee Immobilizer - Left: Discontinue once straight leg raise with < 10 degree lag   Pertinent Vitals/Pain < 3 L knee.    Mobility  Bed Mobility Bed Mobility: Sit to Supine Supine to Sit: 4: Min assist Sit to Supine: 4: Min assist Details for Bed Mobility Assistance: supportig LLe onto bed. Transfers Transfers: Sit to Stand;Stand to Sit Sit to Stand: 4: Min guard;With upper extremity assist Stand to Sit: To bed;4: Min guard Details for Transfer Assistance: cues for leg and hand placement Ambulation/Gait Ambulation/Gait Assistance: 4: Min guard Ambulation/Gait: Patient Percentage: 80% Ambulation Distance (Feet): 60 Feet Assistive device: Rolling walker Ambulation/Gait Assistance Details: cues for sequence and posture Gait Pattern: Step-to pattern    Exercises Total Joint Exercises Quad Sets: AROM;Left;10 reps Heel Slides: AAROM;Left;10 reps Straight Leg Raises: AAROM;Left;10 reps   PT Diagnosis: Difficulty walking;Acute pain  PT Problem List: Decreased strength;Decreased range of motion;Decreased activity tolerance;Decreased mobility;Pain;Decreased knowledge of precautions;Decreased safety  awareness PT Treatment Interventions: DME instruction;Gait training;Stair training;Functional mobility training;Therapeutic activities;Therapeutic exercise;Patient/family education   PT Goals Acute Rehab PT Goals PT Goal Formulation: With patient Time For Goal Achievement: 10/30/12 Potential to Achieve Goals: Good Pt will go Supine/Side to Sit: with supervision PT Goal: Supine/Side to Sit - Progress: Goal set today Pt will go Sit to Supine/Side: with supervision PT Goal: Sit to Supine/Side - Progress: Progressing toward goal Pt will go Sit to Stand: with supervision PT Goal: Sit to Stand - Progress: Progressing toward goal Pt will go Stand to Sit: with supervision PT Goal: Stand to Sit - Progress: Progressing toward goal Pt will Ambulate: >150 feet;with supervision;with rolling walker PT Goal: Ambulate - Progress: Progressing toward goal Pt will Go Up / Down Stairs: 3-5 stairs;with min assist;with least restrictive assistive device PT Goal: Up/Down Stairs - Progress: Goal set today Pt will Perform Home Exercise Program: with supervision, verbal cues required/provided PT Goal: Perform Home Exercise Program - Progress: Progressing toward goal  Visit Information  Last PT Received On: 10/23/12 Assistance Needed: +1    Subjective Data  Subjective: I think that muscle relaxer is making me sick. Patient Stated Goal: To get up and walk.   Cognition  Overall Cognitive Status: Appears within functional limits for tasks assessed/performed Arousal/Alertness: Awake/alert Orientation Level: Appears intact for tasks assessed Behavior During Session: [facility] for tasks performed    Balance     End of Session PT - End of Session Activity Tolerance: Patient tolerated treatment well Patient left: in bed;with call bell/phone within reach;with family/visitor present Nurse Communication: Mobility status   GP     Rada Hay 10/23/2012, 3:22 PM

## 2012-10-23 NOTE — Care Management Note (Addendum)
    Page 1 of 2   10/24/2012     2:39:08 PM   CARE MANAGEMENT NOTE 10/24/2012  Patient:  Gabriela [name], Gabriela [name]   Account Number:  [id number]  Date Initiated:  10/23/2012  Documentation initiated by:  Colleen Can  Subjective/Objective Assessment:   DX OSTEOARTHRITIS LEFT KNEE; TOTAL KNEE REPLACEMNT     Action/Plan:   CM spoke with patient. Plans are for patient to return to her home in [facility] where spouse will be caregiver. She already has crutches, RW. Has handicapped equipped bathroom. Wants to use [facility] in [place] for [facility] services   Anticipated DC Date:  10/25/2012   Anticipated DC Plan:  HOME W HOME HEALTH SERVICES  In-house referral  NA      DC Planning Services  CM consult      [facility] Choice  HOME HEALTH   Choice offered to / List presented to:  C-1 Patient   DME arranged  NA      DME agency  NA     HH arranged  HH-2 PT      HH agency  OTHER - SEE NOTE   Status of service:  Completed, signed off Medicare Important Message given?  NO (If response is "NO", the following Medicare IM given date fields will be blank) Date Medicare IM given:   Date Additional Medicare IM given:    Discharge Disposition:  HOME W HOME HEALTH SERVICES  Per UR Regulation:    If discussed at Long Length of Stay Meetings, dates discussed:    Comments:  10/24/2012 Colleen Can BSN RN CCM [phone] [facility] notified of pt's discharge for today. HHpt services will start tomorrow 10/25/2012.   10/23/2012 Colleen Can BSN RN CCM [phone] [facility]-intake contacted. Spoke with Gavin Pound who requested that face sheet, hh orders, h&p, OP note be faxed to [phone]; contact  ph # [phone].

## 2012-10-23 NOTE — Progress Notes (Signed)
   Subjective: 1 Day Post-Op Procedure(s) (LRB): TOTAL KNEE ARTHROPLASTY (Left) Patient reports pain as mild and moderate.   Patient seen in rounds with Dr. Lequita Halt. Patient is well, but has had some minor complaints of pain in the knee, requiring pain medications. Change her muscle relaxant to Flexeril. We will start therapy today.  Plan is to go Home after hospital stay.  Objective: Vital signs in last 24 hours: Temp:  [97.5 F (36.4 C)-99.3 F (37.4 C)] 99.3 F (37.4 C) (01/30 0505) Pulse Rate:  [59-92] 92  (01/30 0505) Resp:  [12-20] 16  (01/30 0505) BP: (91-128)/(58-77) 116/68 mmHg (01/30 0505) SpO2:  [94 %-100 %] 97 % (01/30 0505) Weight:  [82.1 kg (181 lb)] 82.1 kg (181 lb) (01/29 1507)  Intake/Output from previous day:  Intake/Output Summary (Last 24 hours) at 10/23/12 0827 Last data filed at 10/23/12 0600  Gross per 24 hour  Intake   3019 ml  Output   1855 ml  Net   1164 ml    Intake/Output this shift: UOP 250 since MN +1164  Labs:  [facility] 10/23/12 0427  HGB 11.8*    Basename 10/23/12 0427  WBC 8.0  RBC 4.20  HCT 35.8*  PLT 195    Basename 10/23/12 0427  NA 138  K 4.0  CL 106  CO2 26  BUN 11  CREATININE 0.58  GLUCOSE 144*  CALCIUM 8.3*   No results found for this basename: LABPT:2,INR:2 in the last 72 hours  EXAM General - Patient is Alert, Appropriate and Oriented Extremity - Neurovascular intact Sensation intact distally Dorsiflexion/Plantar flexion intact Dressing - dressing C/D/I Motor Function - intact, moving foot and toes well on exam.  Hemovac pulled without difficulty.  Past Medical History  Diagnosis Date  . Thyroid disease   . PONV (postoperative nausea and vomiting)   . Hypothyroidism 10-13-12    tx. supplement  . Arthritis     osteoarthritis-left knee,s/p RTKA    Assessment/Plan: 1 Day Post-Op Procedure(s) (LRB): TOTAL KNEE ARTHROPLASTY (Left) Principal Problem:  *OA (osteoarthritis) of knee Active Problems:  Postoperative anemia due to acute blood loss  Estimated Body mass index is 29.09 kg/(m^2) as calculated from the following:   Height as of this encounter: 5' 6.142"[Documented 10/13/12[(1.68 m).   Weight as of this encounter: 181 lb(82.1 kg). Advance diet Up with therapy Plan for discharge tomorrow Discharge home with home health Fluid bolus this morning. Slightly low on her UOP since MN.  DVT Prophylaxis - Xarelto Weight-Bearing as tolerated to left leg No vaccines. D/C O2 and Pulse OX and try on Room [address]  Patrica Duel 10/23/2012, 8:27 AM

## 2012-10-23 NOTE — Evaluation (Addendum)
Physical Therapy Evaluation Patient Details Name: Gabriela [name] MRN: [id number] DOB: 1956-08-31 Today's Date: 10/23/2012 Time: 4098-1191 PT Time Calculation (min): 30 min  PT Assessment / Plan / Recommendation Clinical Impression  Pt. is 57 yo female s/p Ltka on 10/22/12. Pt was able to ambulate x 30 ft. Pt. will benefit from PT to improve ROM and function to DC to home.    PT Assessment  Patient needs continued PT services    Follow Up Recommendations  Home health PT    Does the patient have the potential to tolerate intense rehabilitation      Barriers to Discharge        Equipment Recommendations  None recommended by PT    Recommendations for Other Services     Frequency 7X/week    Precautions / Restrictions Precautions Precautions: Knee Required Braces or Orthoses: Knee Immobilizer - left Knee Immobilizer - left: Discontinue once straight leg raise with < 10 degree lag   Pertinent Vitals/Pain 4/10 L knee.      Mobility  Bed Mobility Bed Mobility: Supine to Sit Supine to Sit: 4: Min assist Details for Bed Mobility Assistance: supporting LLE to lower to floor Transfers Transfers: Sit to Stand;Stand to Sit Sit to Stand: 4: Min assist;From bed;With upper extremity assist Stand to Sit: To chair/3-in-1;With upper extremity assist;4: Min assist Details for Transfer Assistance: cues for L leg and hand placement. Ambulation/Gait Ambulation/Gait Assistance: 1: +2 Total assist Ambulation/Gait: Patient Percentage: 80% Ambulation Distance (Feet): 30 Feet Assistive device: Rolling walker Ambulation/Gait Assistance Details: cues for sequence and posture. Gait Pattern: Step-to pattern;Antalgic    Shoulder Instructions     Exercises Total Joint Exercises Quad Sets: AROM;Left;10 reps Straight Leg Raises: AAROM;Left;5 reps;Supine   PT Diagnosis: Difficulty walking;Acute pain  PT Problem List: Decreased strength;Decreased range of motion;Decreased activity  tolerance;Decreased mobility;Pain;Decreased knowledge of precautions;Decreased safety awareness PT Treatment Interventions: DME instruction;Gait training;Stair training;Functional mobility training;Therapeutic activities;Therapeutic exercise;Patient/family education   PT Goals Acute Rehab PT Goals PT Goal Formulation: With patient Time For Goal Achievement: 10/30/12 Potential to Achieve Goals: Good Pt will go Supine/Side to Sit: with supervision PT Goal: Supine/Side to Sit - Progress: Goal set today Pt will go Sit to Supine/Side: with supervision PT Goal: Sit to Supine/Side - Progress: Goal set today Pt will go Sit to Stand: with supervision PT Goal: Sit to Stand - Progress: Goal set today Pt will go Stand to Sit: with supervision PT Goal: Stand to Sit - Progress: Goal set today Pt will Ambulate: >150 feet;with supervision;with rolling walker PT Goal: Ambulate - Progress: Goal set today Pt will Go Up / Down Stairs: 3-5 stairs;with min assist;with least restrictive assistive device PT Goal: Up/Down Stairs - Progress: Goal set today Pt will Perform Home Exercise Program: with supervision, verbal cues required/provided PT Goal: Perform Home Exercise Program - Progress: Goal set today  Visit Information  Last PT Received On: 10/23/12 Assistance Needed: +1    Subjective Data  Subjective: I don't remember it burning. Patient Stated Goal: To get up and walk.   Prior Functioning  Home Living Lives With: Spouse Available Help at Discharge: Family Type of Home: House Home Access: Stairs to enter Secretary/administrator of Steps: 7 Entrance Stairs-Rails: Right;Left Home Layout: One level Bathroom Shower/Tub: Health visitor: Handicapped height Home Adaptive Equipment: Bedside commode/3-in-1;Walker - rolling;Crutches;Straight cane Prior Function Level of Independence: Independent Able to Take Stairs?: Yes Communication Communication: No difficulties    Cognition   Overall Cognitive Status: Appears within functional  limits for tasks assessed/performed Arousal/Alertness: Awake/alert Orientation Level: Appears intact for tasks assessed Behavior During Session: [facility] for tasks performed    Extremity/Trunk Assessment Right Upper Extremity Assessment RUE ROM/Strength/Tone: [facility] for tasks assessed Left Upper Extremity Assessment LUE ROM/Strength/Tone: WFL for tasks assessed  LLE ROM/Strength/Tone: deficits. LLE ROM/Strength/Tone Deficits: erequires assistance for SLR. Knee flex 30 degrees LLE Sensation: WFL - Light Touch  RLE ROM/Strength/Tone: WFL for tasks assessed Trunk Assessment Trunk Assessment: Normal   Balance    End of Session PT - End of Session Activity Tolerance: Patient tolerated treatment well Patient left: in chair;with call bell/phone within reach;with family/visitor present Nurse Communication: Mobility status  GP     Rada Hay 10/23/2012, 12:57 PM  [phone]

## 2012-10-24 LAB — BASIC METABOLIC PANEL
BUN: 8 mg/dL (ref 6–23)
Calcium: 8.4 mg/dL (ref 8.4–10.5)
GFR calc Af Amer: >90 mL/min (ref >90)
GFR calc non Af Amer: >90 mL/min (ref >90)
Potassium: 3.9 mEq/L (ref 3.5–5.1)
Sodium: 136 mEq/L (ref 135–145)

## 2012-10-24 LAB — CBC
HCT: 32.4 % — ABNORMAL LOW (ref 36.0–46.0)
MCH: 28.7 pg (ref 26.0–34.0)
MCHC: 33.6 g/dL (ref 30.0–36.0)
RDW: 12.8 % (ref 11.5–15.5)

## 2012-10-24 MED ORDER — TRAMADOL HCL 50 MG PO TABS: 50.0000 mg | ORAL_TABLET | Freq: Four times a day (QID) | ORAL | Status: DC | PRN

## 2012-10-24 MED ORDER — RIVAROXABAN 10 MG PO TABS: 10.0000 mg | ORAL_TABLET | Freq: Every day | ORAL | Status: DC

## 2012-10-24 MED ORDER — CYCLOBENZAPRINE HCL 10 MG PO TABS: 10.0000 mg | ORAL_TABLET | Freq: Three times a day (TID) | ORAL | Status: DC | PRN

## 2012-10-24 MED ORDER — OXYCODONE HCL 5 MG PO TABS: 5.0000 mg | ORAL_TABLET | ORAL | Status: DC | PRN

## 2012-10-24 NOTE — Progress Notes (Signed)
Discharge summary sent to payer through [facility]  

## 2012-10-24 NOTE — Discharge Summary (Signed)
Physician Discharge Summary   Patient ID: Gabriela [name] MRN: [id number] DOB/AGE: 06-04-1956 57 y.o.  Admit date: 10/22/2012 Discharge date: 10/24/2012  Primary Diagnosis: Osteoarthritis Left knee  Admission Diagnoses:  Past Medical History  Diagnosis Date  . Thyroid disease   . PONV (postoperative nausea and vomiting)   . Hypothyroidism 10-13-12    tx. supplement  . Arthritis     osteoarthritis-left knee,s/p RTKA   Discharge Diagnoses:   Principal Problem:  *OA (osteoarthritis) of knee Active Problems:  Postoperative anemia due to acute blood loss  Estimated Body mass index is 29.09 kg/(m^2) as calculated from the following:   Height as of this encounter: 5' 6.142"[Documented 10/13/12[(1.68 m).   Weight as of this encounter: 181 lb(82.1 kg).  Classification of overweight in adults according to BMI (WHO, 1998)   Procedure:  Procedure(s) (LRB): TOTAL KNEE ARTHROPLASTY (Left)   Consults: None  HPI: Gabriela [name] is a 57 y.o. year old female with end stage OA of her left knee with progressively worsening pain and dysfunction. She has constant pain, with activity and at rest and significant functional deficits with difficulties even with ADLs. She has had extensive non-op management including analgesics, injections of cortisone and viscosupplements, and home exercise program, but remains in significant pain with significant dysfunction. Radiographs show bone on bone arthritis medial and patellofemoral with varus deformity. She presents now for left Total Knee Arthroplasty  Laboratory Data: Admission on 10/22/2012  Component Date Value Range Status  . ABO/RH(D) 10/22/2012 A POS   Final  . Antibody Screen 10/22/2012 NEG   Final  . Sample Expiration 10/22/2012 10/25/2012   Final  . WBC 10/23/2012 8.0  4.0 - 10.5 K/uL Final  . RBC 10/23/2012 4.20  3.87 - 5.11 MIL/uL Final  . Hemoglobin 10/23/2012 11.8* 12.0 - 15.0 g/dL Final  . HCT 40/98/1191 35.8* 36.0 - 46.0 % Final  . MCV  10/23/2012 85.2  78.0 - 100.0 fL Final  . MCH 10/23/2012 28.1  26.0 - 34.0 pg Final  . MCHC 10/23/2012 33.0  30.0 - 36.0 g/dL Final  . RDW 47/82/9562 12.8  11.5 - 15.5 % Final  . Platelets 10/23/2012 195  150 - 400 K/uL Final  . Sodium 10/23/2012 138  135 - 145 mEq/L Final  . Potassium 10/23/2012 4.0  3.5 - 5.1 mEq/L Final  . Chloride 10/23/2012 106  96 - 112 mEq/L Final  . CO2 10/23/2012 26  19 - 32 mEq/L Final  . Glucose, Bld 10/23/2012 144* 70 - 99 mg/dL Final  . BUN 13/04/6577 11  6 - 23 mg/dL Final  . Creatinine, Ser 10/23/2012 0.58  0.50 - 1.10 mg/dL Final  . Calcium 46/96/2952 8.3* 8.4 - 10.5 mg/dL Final  . GFR calc non Af Amer 10/23/2012 >90  >90 mL/min Final  . GFR calc Af Amer 10/23/2012 >90  >90 mL/min Final   Comment:                                 The eGFR has been calculated                          using the CKD EPI equation.                          This calculation has not been  validated in all clinical                          situations.                          eGFR's persistently                          <90 mL/min signify                          possible Chronic Kidney Disease.  . WBC 10/24/2012 11.5* 4.0 - 10.5 K/uL Final  . RBC 10/24/2012 3.80* 3.87 - 5.11 MIL/uL Final  . Hemoglobin 10/24/2012 10.9* 12.0 - 15.0 g/dL Final  . HCT 16/06/9603 32.4* 36.0 - 46.0 % Final  . MCV 10/24/2012 85.3  78.0 - 100.0 fL Final  . MCH 10/24/2012 28.7  26.0 - 34.0 pg Final  . MCHC 10/24/2012 33.6  30.0 - 36.0 g/dL Final  . RDW 54/05/8118 12.8  11.5 - 15.5 % Final  . Platelets 10/24/2012 184  150 - 400 K/uL Final  . Sodium 10/24/2012 136  135 - 145 mEq/L Final  . Potassium 10/24/2012 3.9  3.5 - 5.1 mEq/L Final  . Chloride 10/24/2012 103  96 - 112 mEq/L Final  . CO2 10/24/2012 26  19 - 32 mEq/L Final  . Glucose, Bld 10/24/2012 126* 70 - 99 mg/dL Final  . BUN 14/78/2956 8  6 - 23 mg/dL Final  . Creatinine, Ser 10/24/2012 0.52  0.50 - 1.10 mg/dL  Final  . Calcium 21/30/8657 8.4  8.4 - 10.5 mg/dL Final  . GFR calc non Af Amer 10/24/2012 >90  >90 mL/min Final  . GFR calc Af Amer 10/24/2012 >90  >90 mL/min Final   Comment:                                 The eGFR has been calculated                          using the CKD EPI equation.                          This calculation has not been                          validated in all clinical                          situations.                          eGFR's persistently                          <90 mL/min signify                          possible Chronic Kidney Disease.  Hospital Outpatient Visit on 10/13/2012  Component Date Value Range Status  . MRSA, PCR 10/13/2012 NEGATIVE  NEGATIVE Final  . Staphylococcus aureus 10/13/2012 NEGATIVE  NEGATIVE Final   Comment:  The Xpert SA Assay (FDA                          approved for NASAL specimens                          in patients over 33 years of age),                          is one component of                          a comprehensive surveillance                          program.  Test performance has                          been validated by [employer] for patients greater                          than or equal to 78 year old.                          It is not intended                          to diagnose infection nor to                          guide or monitor treatment.  Marland Kitchen aPTT 10/13/2012 29  24 - 37 seconds Final  . WBC 10/13/2012 5.3  4.0 - 10.5 K/uL Final  . RBC 10/13/2012 5.15* 3.87 - 5.11 MIL/uL Final  . Hemoglobin 10/13/2012 14.9  12.0 - 15.0 g/dL Final  . HCT 16/06/9603 44.0  36.0 - 46.0 % Final  . MCV 10/13/2012 85.4  78.0 - 100.0 fL Final  . MCH 10/13/2012 28.9  26.0 - 34.0 pg Final  . MCHC 10/13/2012 33.9  30.0 - 36.0 g/dL Final  . RDW 54/05/8118 12.7  11.5 - 15.5 % Final  . Platelets 10/13/2012 235  150 - 400 K/uL Final  . Sodium 10/13/2012 139  135  - 145 mEq/L Final  . Potassium 10/13/2012 3.9  3.5 - 5.1 mEq/L Final  . Chloride 10/13/2012 104  96 - 112 mEq/L Final  . CO2 10/13/2012 27  19 - 32 mEq/L Final  . Glucose, Bld 10/13/2012 89  70 - 99 mg/dL Final  . BUN 14/78/2956 13  6 - 23 mg/dL Final  . Creatinine, Ser 10/13/2012 0.70  0.50 - 1.10 mg/dL Final  . Calcium 21/30/8657 9.5  8.4 - 10.5 mg/dL Final  . Total Protein 10/13/2012 7.1  6.0 - 8.3 g/dL Final  . Albumin 84/69/6295 3.8  3.5 - 5.2 g/dL Final  . AST 28/41/3244 18  0 - 37 U/L Final  . ALT 10/13/2012 15  0 - 35 U/L Final  . Alkaline Phosphatase 10/13/2012 107  39 - 117 U/L Final  . Total Bilirubin 10/13/2012 0.6  0.3 - 1.2 mg/dL Final  .  GFR calc non Af Amer 10/13/2012 >90  >90 mL/min Final  . GFR calc Af Amer 10/13/2012 >90  >90 mL/min Final   Comment:                                 The eGFR has been calculated                          using the CKD EPI equation.                          This calculation has not been                          validated in all clinical                          situations.                          eGFR's persistently                          <90 mL/min signify                          possible Chronic Kidney Disease.  Marland Kitchen Prothrombin Time 10/13/2012 12.7  11.6 - 15.2 seconds Final  . INR 10/13/2012 0.96  0.00 - 1.49 Final  . Color, Urine 10/13/2012 YELLOW  YELLOW Final  . APPearance 10/13/2012 CLEAR  CLEAR Final  . Specific Gravity, Urine 10/13/2012 1.023  1.005 - 1.030 Final  . pH 10/13/2012 7.5  5.0 - 8.0 Final  . Glucose, UA 10/13/2012 NEGATIVE  NEGATIVE mg/dL Final  . Hgb urine dipstick 10/13/2012 NEGATIVE  NEGATIVE Final  . Bilirubin Urine 10/13/2012 NEGATIVE  NEGATIVE Final  . Ketones, ur 10/13/2012 NEGATIVE  NEGATIVE mg/dL Final  . Protein, ur 57/84/6962 NEGATIVE  NEGATIVE mg/dL Final  . Urobilinogen, UA 10/13/2012 0.2  0.0 - 1.0 mg/dL Final  . Nitrite 95/28/4132 NEGATIVE  NEGATIVE Final  . Leukocytes, UA 10/13/2012 NEGATIVE   NEGATIVE Final   MICROSCOPIC NOT DONE ON URINES WITH NEGATIVE PROTEIN, BLOOD, LEUKOCYTES, NITRITE, OR GLUCOSE <1000 mg/dL.     X-Rays:No results found.  EKG:No orders found for this or any previous visit.   Hospital Course: Gabriela [name] is a 57 y.o. who was admitted to [facility]. They were brought to the operating room on 10/22/2012 and underwent Procedure(s): TOTAL KNEE ARTHROPLASTY.  Patient tolerated the procedure well and was later transferred to the recovery room and then to the orthopaedic floor for postoperative care.  They were given PO and IV analgesics for pain control following their surgery.  They were given 24 hours of postoperative antibiotics of  Anti-infectives     Start     Dose/Rate Route Frequency Ordered Stop   10/22/12 2300   vancomycin (VANCOCIN) IVPB 1000 mg/200 mL premix        1,000 mg 200 mL/hr over 60 Minutes Intravenous Every 12 hours 10/22/12 1510 10/23/12 0022   10/22/12 1052   vancomycin (VANCOCIN) IVPB 1000 mg/200 mL premix        1,000 mg 200 mL/hr over 60 Minutes Intravenous 120 min pre-op 10/22/12 1053  10/22/12 1105   10/21/12 1728   vancomycin (VANCOCIN) 1,500 mg in sodium chloride 0.9 % 500 mL IVPB  Status:  Discontinued        1,500 mg 250 mL/hr over 120 Minutes Intravenous 120 min pre-op 10/21/12 1728 10/22/12 1053         and started on DVT prophylaxis in the form of Xarelto.   PT and OT were ordered for total joint protocol.  Discharge planning consulted to help with postop disposition and equipment needs.  Patient had a tough night on the evening of surgery due to spasms but started to get up OOB with therapy on day one walking 30 and then 60 feet. Hemovac drain was pulled without difficulty.  Continued to work with therapy into day two walking 150 feet.  Dressing was changed on day two and the incision was healing well.  Patient was seen in rounds and was ready to go home later that day.   Discharge Medications: Prior to Admission  medications   Medication Sig Start Date End Date Taking? Authorizing Provider  thyroid (ARMOUR) 120 MG tablet Take 120 mg by mouth daily before breakfast.    Yes Historical Provider, MD  cyclobenzaprine (FLEXERIL) 10 MG tablet Take 1 tablet (10 mg total) by mouth 3 (three) times daily as needed for muscle spasms. 10/24/12   Alexzandrew Perkins, PA  oxyCODONE (OXY IR/ROXICODONE) 5 MG immediate release tablet Take 1-2 tablets (5-10 mg total) by mouth every 3 (three) hours as needed. 10/24/12   Alexzandrew Julien Girt, PA  rivaroxaban (XARELTO) 10 MG TABS tablet Take 1 tablet (10 mg total) by mouth daily with breakfast. Take Xarelto for two and a half more weeks, then discontinue Xarelto. 10/24/12   Alexzandrew Julien Girt, PA  traMADol (ULTRAM) 50 MG tablet Take 1-2 tablets (50-100 mg total) by mouth every 6 (six) hours as needed (mild pain). 10/24/12   Alexzandrew Julien Girt, PA    Diet: Regular diet Activity:WBAT Follow-up:in 2 weeks Disposition - Home Discharged Condition: good   Discharge Orders    Future Orders Please Complete By Expires   Diet general      Call MD / Call 911      Comments:   If you experience chest pain or shortness of breath, CALL 911 and be transported to the hospital emergency room.  If you develope a fever above 101 F, pus (white drainage) or increased drainage or redness at the wound, or calf pain, call your surgeon's office.   Discharge instructions      Comments:   Pick up stool softner (Colace) and laxative for home (Miralax). Do not submerge incision under water. May shower. Continue to use ice for pain and swelling from surgery.   Take Xarelto for two and a half more weeks, then discontinue Xarelto.   Constipation Prevention      Comments:   Drink plenty of fluids.  Prune juice may be helpful.  You may use a stool softener, such as Colace (over the counter) 100 mg twice a day.  Use MiraLax (over the counter) for constipation as needed.   Increase activity slowly as  tolerated      Patient may shower      Comments:   You may shower without a dressing once there is no drainage.  Do not wash over the wound.  If drainage remains, do not shower until drainage stops.   Weight bearing as tolerated      Driving restrictions      Comments:  No driving until released by the physician.   Lifting restrictions      Comments:   No lifting until released by the physician.   TED hose      Comments:   Use stockings (TED hose) for 3 weeks on both leg(s).  You may remove them at night for sleeping.   Change dressing      Comments:   Change dressing daily with sterile 4 x 4 inch gauze dressing and apply TED hose. Do not submerge the incision under water.   Do not put a pillow under the knee. Place it under the heel.      Do not sit on low chairs, stoools or toilet seats, as it may be difficult to get up from low surfaces          Medication List     As of 10/24/2012  8:11 AM    STOP taking these medications         cholecalciferol 1000 UNITS tablet   Commonly known as: VITAMIN D      DHEA 10 MG Caps      Diindolylmethane Powd      IODINE STRONG PO      MAGNESIUM GLYCINATE PLUS PO      multivitamin with minerals Tabs      N-Acetyl-L-Cysteine 600 MG Caps      OMEGA 3 PO      OVER THE COUNTER MEDICATION      pyridOXINE 50 MG tablet   Commonly known as: VITAMIN B-6      vitamin A 16109 UNIT capsule      VITAMIN C PO      TAKE these medications         cyclobenzaprine 10 MG tablet   Commonly known as: FLEXERIL   Take 1 tablet (10 mg total) by mouth 3 (three) times daily as needed for muscle spasms.      oxyCODONE 5 MG immediate release tablet   Commonly known as: Oxy IR/ROXICODONE   Take 1-2 tablets (5-10 mg total) by mouth every 3 (three) hours as needed.      rivaroxaban 10 MG Tabs tablet   Commonly known as: XARELTO   Take 1 tablet (10 mg total) by mouth daily with breakfast. Take Xarelto for two and a half more weeks, then  discontinue Xarelto.      thyroid 120 MG tablet   Commonly known as: ARMOUR   Take 120 mg by mouth daily before breakfast.      traMADol 50 MG tablet   Commonly known as: ULTRAM   Take 1-2 tablets (50-100 mg total) by mouth every 6 (six) hours as needed (mild pain).           Follow-up Information    Follow up with Loanne Drilling, MD. Schedule an appointment as soon as possible for a visit in 1 week.   Contact information:   [address] [place] [address] [phone]          Signed: Patrica Duel 10/24/2012, 8:11 AM

## 2012-10-24 NOTE — Progress Notes (Signed)
   Subjective: 2 Days Post-Op Procedure(s) (LRB): TOTAL KNEE ARTHROPLASTY (Left) Patient reports pain as mild.   Patient seen in rounds with Dr. Lequita Halt. Family in room at bedside. Patient is well, and has had no acute complaints or problems Patient is ready to go home later today.  Objective: Vital signs in last 24 hours: Temp:  [98.1 F (36.7 C)-98.7 F (37.1 C)] 98.7 F (37.1 C) (01/31 0616) Pulse Rate:  [78-100] 78  (01/31 0616) Resp:  [16-18] 16  (01/31 0616) BP: (115-123)/(71-78) 115/71 mmHg (01/31 0616) SpO2:  [95 %-99 %] 95 % (01/31 0616)  Intake/Output from previous day:  Intake/Output Summary (Last 24 hours) at 10/24/12 0802 Last data filed at 10/24/12 0616  Gross per 24 hour  Intake 1901.25 ml  Output   1500 ml  Net 401.25 ml    Intake/Output this shift:    Labs:  Basename 10/24/12 0425 10/23/12 0427  HGB 10.9* 11.8*    Basename 10/24/12 0425 10/23/12 0427  WBC 11.5* 8.0  RBC 3.80* 4.20  HCT 32.4* 35.8*  PLT 184 195    Basename 10/24/12 0425 10/23/12 0427  NA 136 138  K 3.9 4.0  CL 103 106  CO2 26 26  BUN 8 11  CREATININE 0.52 0.58  GLUCOSE 126* 144*  CALCIUM 8.4 8.3*   No results found for this basename: LABPT:2,INR:2 in the last 72 hours  EXAM: General - Patient is Alert, Appropriate and Oriented Extremity - Neurovascular intact Sensation intact distally Dorsiflexion/Plantar flexion intact No cellulitis present Incision - clean, dry, no drainage, healing Motor Function - intact, moving foot and toes well on exam.   Assessment/Plan: 2 Days Post-Op Procedure(s) (LRB): TOTAL KNEE ARTHROPLASTY (Left) Procedure(s) (LRB): TOTAL KNEE ARTHROPLASTY (Left) Past Medical History  Diagnosis Date  . Thyroid disease   . PONV (postoperative nausea and vomiting)   . Hypothyroidism 10-13-12    tx. supplement  . Arthritis     osteoarthritis-left knee,s/p RTKA   Principal Problem:  *OA (osteoarthritis) of knee Active Problems:  Postoperative  anemia due to acute blood loss  Estimated Body mass index is 29.09 kg/(m^2) as calculated from the following:   Height as of this encounter: 5' 6.142"[Documented 10/13/12[(1.68 m).   Weight as of this encounter: 181 lb(82.1 kg). Up with therapy Discharge home with home health Diet - Regular diet Follow up - in 2 weeks Activity - WBAT Disposition - Home Condition Upon Discharge - Good D/C Meds - See DC Summary DVT Prophylaxis - Xarelto  PERKINS, ALEXZANDREW 10/24/2012, 8:02 AM

## 2012-10-24 NOTE — Progress Notes (Signed)
Physical Therapy Treatment Patient Details Name: Gabriela [name] MRN: [id number] DOB: 24-Sep-1956 Today's Date: 10/24/2012 Time: 1015-1100 PT Time Calculation (min): 45 min  PT Assessment / Plan / Recommendation Comments on Treatment Session  Practiced ambulation, stair negotiation, exercises. All education completed. Instructed pt to perform exercises one more time this evening at home. Recommend HHPT. Ready for d/c    Follow Up Recommendations  Home health PT     Does the patient have the potential to tolerate intense rehabilitation     Barriers to Discharge        Equipment Recommendations  None recommended by PT    Recommendations for Other Services    Frequency 7X/week   Plan Discharge plan remains appropriate    Precautions / Restrictions Precautions Precautions: Knee Required Braces or Orthoses: Knee Immobilizer - Left Knee Immobilizer - Left: Discontinue once straight leg raise with < 10 degree lag Restrictions Weight Bearing Restrictions: No LLE Weight Bearing: Weight bearing as tolerated   Pertinent Vitals/Pain 4/10 L knee    Mobility  Bed Mobility Bed Mobility: Supine to Sit;Sit to Supine Supine to Sit: 4: Min assist Sit to Supine: 4: Min assist Details for Bed Mobility Assistance: Assist for L LE Transfers Transfers: Sit to Stand;Stand to Sit Sit to Stand: 4: Min guard;From bed;From chair/3-in-1 Stand to Sit: 4: Min guard;To bed;To chair/3-in-1 Details for Transfer Assistance: cues for leg and hand placement  Ambulation/Gait Ambulation/Gait Assistance: 4: Min guard Ambulation Distance (Feet): 150 Feet Assistive device: Rolling walker Ambulation/Gait Assistance Details: cues for sequence and posture  Gait Pattern: Step-to pattern;Antalgic;Trunk flexed;Decreased stride length Stairs: Yes Stairs Assistance: 4: Min guard Stairs Assistance Details (indicate cue type and reason): VCs safety, technique, sequence.  Stair Management Technique: One rail  Right;With crutches;Step to pattern;Forwards Number of Stairs: 4     Exercises Total Joint Exercises Ankle Circles/Pumps: AROM;Both;10 reps;Supine Quad Sets: AROM;Both;10 reps;Supine Heel Slides: AAROM;Left;10 reps;Supine (pt used sheet to assist) Hip ABduction/ADduction: AAROM;Left;10 reps;Supine Straight Leg Raises: AROM;Left;10 reps;Supine (pt used sheet to assist)   PT Diagnosis:    PT Problem List:   PT Treatment Interventions:     PT Goals Acute Rehab PT Goals Pt will go Supine/Side to Sit: with supervision PT Goal: Supine/Side to Sit - Progress: Progressing toward goal Pt will go Sit to Supine/Side: with supervision PT Goal: Sit to Supine/Side - Progress: Progressing toward goal Pt will go Sit to Stand: with supervision PT Goal: Sit to Stand - Progress: Progressing toward goal Pt will go Stand to Sit: with supervision PT Goal: Stand to Sit - Progress: Progressing toward goal Pt will Ambulate: >150 feet;with supervision;with rolling walker PT Goal: Ambulate - Progress: Progressing toward goal Pt will Go Up / Down Stairs: 3-5 stairs;with min assist;with least restrictive assistive device PT Goal: Up/Down Stairs - Progress: Met Pt will Perform Home Exercise Program: with supervision, verbal cues required/provided PT Goal: Perform Home Exercise Program - Progress: Progressing toward goal  Visit Information  Last PT Received On: 10/24/12 Assistance Needed: +1    Subjective Data      Cognition  Overall Cognitive Status: Appears within functional limits for tasks assessed/performed Arousal/Alertness: Awake/alert Orientation Level: Appears intact for tasks assessed Behavior During Session: Gabriela [name] for tasks performed    Balance     End of Session PT - End of Session Equipment Utilized During Treatment: Gait belt;Left knee immobilizer Activity Tolerance: Patient tolerated treatment well Patient left: with call bell/phone within reach;with family/visitor present   GP  Gabriela [name] Alert Gabriela [name] 10/24/2012, 12:24 PM [phone]

## 2012-11-05 ENCOUNTER — Encounter: Payer: Self-pay | Admitting: Orthopedic Surgery

## 2012-11-22 ENCOUNTER — Encounter: Payer: Self-pay | Admitting: Orthopedic Surgery

## 2012-12-23 ENCOUNTER — Encounter: Payer: Self-pay | Admitting: Orthopedic Surgery

## 2013-01-22 ENCOUNTER — Encounter: Payer: Self-pay | Admitting: Orthopedic Surgery

## 2013-12-28 ENCOUNTER — Encounter: Payer: Self-pay | Admitting: General Surgery

## 2013-12-28 ENCOUNTER — Ambulatory Visit (INDEPENDENT_AMBULATORY_CARE_PROVIDER_SITE_OTHER): Payer: BC Managed Care – PPO | Admitting: General Surgery

## 2013-12-28 VITALS — Ht 66.0 in | Wt 190.0 lb

## 2013-12-28 DIAGNOSIS — N63 Unspecified lump in unspecified breast: Secondary | ICD-10-CM

## 2013-12-28 DIAGNOSIS — R928 Other abnormal and inconclusive findings on diagnostic imaging of breast: Secondary | ICD-10-CM

## 2013-12-28 NOTE — Progress Notes (Signed)
Patient ID: Gabriela [name], female   DOB: [birth year], 58 y.o.   MRN: [id number]  Chief Complaint  Patient presents with  . Other    abnormal mammogram    HPI Gabriela [name] is a 58 y.o. female here for follow up mammogram done at [facility] 12/08/13 with follow up views and ultrasound of left breast on 12/11/13 Cat 4. She has a history of left breast lumpectomy done by Dr Abbey Chatters at [facility] in 2012 with  complications afterwards including a large hematoma. The patient reported that the left breast was larger in size than the right breast for over a year. She is since returned to baseline. The patient did not develop a wound infection postoperatively. It took the better part of 12-18 months for the breast to return to its normal volume and contour post procedure.  The mammogram in question is her first mammogram since the surgery in 2012. She has had two thermal scans done of her breasts since the surgery.  HPI  Past Medical History  Diagnosis Date  . Thyroid disease   . PONV (postoperative nausea and vomiting)   . Hypothyroidism 10-13-12    tx. supplement  . Arthritis 2008    osteoarthritis-left knee,s/p RTKA    Past Surgical History  Procedure Laterality Date  . Total knee arthroplasty  10/22/2012    Procedure: TOTAL KNEE ARTHROPLASTY;  Surgeon: Loanne Drilling, MD;  Location: [facility];  Service: Orthopedics;  Laterality: Left;  . Colonoscopy  2009    Dr Kinnie Scales  . Cholecystectomy  10/1998  . Joint replacement  2012    right  . Joint replacement Left 10/22/12    Knee  . Joint replacement Right 2006    Knee  . Breast surgery  06/21/11    left breast biopsy and SNL, pathology showed benign fibrocystic changes.    Family History  Problem Relation Age of Onset  . Cancer Mother     breast  . Cancer Father 26    pancreatic  . Cancer Maternal Aunt     breast  . Cancer Paternal Grandfather     lung    Social History History  Substance Use Topics  . Smoking status:  Never Smoker   . Smokeless tobacco: Never Used  . Alcohol Use: No    Allergies  Allergen Reactions  . Macrolides And Ketolides Hives  . Tetracyclines & Related Hives  . Ampicillin Hives  . Erythromycin Hives  . Metronidazole Rash  . Sulfa Antibiotics Hives    All over chest    Current Outpatient Prescriptions  Medication Sig Dispense Refill  . Acetylcysteine (NAC) 600 MG CAPS Take by mouth.      . Beta Carotene (VITAMIN A) 25000 UNIT capsule Take 25,000 Units by mouth daily.      . Cholecalciferol (VITAMIN D) 2000 UNITS CAPS Take 15,000 Units by mouth daily.      . Digestive Enzymes (DIGESTIVE ENZYME PO) Take by mouth.      . Ferrous Sulfate (IRON) 90 (18 FE) MG TABS Take by mouth.      . fluconazole (DIFLUCAN) 200 MG tablet 100 mg daily.       . Iodine Strong, Lugols, (LUGOLS PO) Take 50 mg by mouth 2 (two) times daily.      Marland Kitchen MAGNESIUM GLYCINATE PLUS PO Take by mouth 2 (two) times daily.      . Multiple Vitamins-Minerals (MULTIVITAMIN PO) Take by mouth.      Marland Kitchen NON  FORMULARY DIM 300 mg with Broccoli 460 mg      . Nutritional Supplements (DHEA PO) Take 15 mg by mouth.      . Omega-3 Fatty Acids (FISH OIL) 500 MG CAPS Take 1 capsule by mouth 3 (three) times daily.      Marland Kitchen. thyroid (ARMOUR) 120 MG tablet Take 120 mg by mouth daily before breakfast.        No current facility-administered medications for this visit.    Review of Systems Review of Systems  Constitutional: Negative.   Respiratory: Negative.   Cardiovascular: Negative.     Height 5\' 6"  (1.676 m), weight 190 lb (86.183 kg).  Physical Exam Physical Exam  Constitutional: She is oriented to person, place, and time. She appears well-developed and well-nourished.  Neck: Neck supple.  Cardiovascular: Normal rate, regular rhythm and normal heart sounds.   Pulmonary/Chest: Effort normal and breath sounds normal. Right breast exhibits no inverted nipple, no mass, no nipple discharge, no skin change and no tenderness.  Left breast exhibits skin change (3 cm area of thickening in prefirery of breast ). Left breast exhibits no inverted nipple, no mass, no nipple discharge and no tenderness.  Right breast soft.   Lymphadenopathy:    She has no cervical adenopathy.    She has no axillary adenopathy.  Neurological: She is alert and oriented to person, place, and time.    Data Reviewed Pathology from the June 21, 2011 examination showed benign fibrocystic changes with associated microcalcifications. No atypia, hyperplasia or malignancy. No lymph node tissue was reported in the specimen submitted.  Mammogram dated December 08, 2013 as well as additional views and ultrasound dated December 11, 2013 were reviewed. There is near regular density in the area of previous excision measuring up to 3.8 cm in diameter. Ultrasound shows an irregular hypoechoic masses not well circumscribed. BI-RAD 4. Ultrasound-guided biopsy recommended.  May 23, 2011 mammogram head showed a cluster of indeterminate calcifications in the left breast. Stereotactic biopsy was recommended.  May 25, 2008 biopsy showed flat epithelial atypia. (Completed through this office) patient declined Chemotherapy prevention, with an estimated absolute benefit of 1.4% I thought this was appropriate.  Assessment    Postbiopsy mammographic abnormality, likely related to hematoma post procedure.     Plan    Options for management were reviewed: 1) early biopsy versus 2) 6 month observation. At this time the patient has a low anxiety level (in part evident by her 2-1/2 year interval for followup mammogram post biopsy) and is leaning towards observation. I think this is reasonable, and biopsy can be undertaken if she changes her mind in the interval.  Arrangements will be made for a left breast diagnostic mammogram in 6 months.        Earline MayotteByrnett, Spencer Peterkin W 12/29/2013, 9:03 PM

## 2013-12-28 NOTE — Patient Instructions (Addendum)
Continue self breast exams. Call office for any new breast issues or concerns. Return in 6 months.

## 2013-12-29 ENCOUNTER — Encounter: Payer: Self-pay | Admitting: General Surgery

## 2013-12-29 DIAGNOSIS — R928 Other abnormal and inconclusive findings on diagnostic imaging of breast: Secondary | ICD-10-CM | POA: Insufficient documentation

## 2014-01-19 ENCOUNTER — Encounter: Payer: Self-pay | Admitting: General Surgery

## 2014-06-10 ENCOUNTER — Encounter: Payer: Self-pay | Admitting: General Surgery

## 2014-06-16 ENCOUNTER — Ambulatory Visit (INDEPENDENT_AMBULATORY_CARE_PROVIDER_SITE_OTHER): Payer: BC Managed Care – PPO | Admitting: General Surgery

## 2014-06-16 ENCOUNTER — Other Ambulatory Visit: Payer: Self-pay | Admitting: General Surgery

## 2014-06-16 ENCOUNTER — Encounter: Payer: Self-pay | Admitting: General Surgery

## 2014-06-16 VITALS — BP 122/76 | HR 72 | Resp 14 | Ht 66.5 in | Wt 200.0 lb

## 2014-06-16 DIAGNOSIS — R229 Localized swelling, mass and lump, unspecified: Secondary | ICD-10-CM

## 2014-06-16 DIAGNOSIS — N63 Unspecified lump in unspecified breast: Secondary | ICD-10-CM

## 2014-06-16 NOTE — Progress Notes (Signed)
Patient ID: Gabriela [name], female   DOB: Feb 11, 1956, 58 y.o.   MRN: [id number]  Chief Complaint  Patient presents with  . Follow-up    5 month left diagnostic mammogram     HPI [name] is a 58 y.o. female who presents for a breast evaluation. The most recent mammogram was done on 06/10/14. Patient does perform regular self breast checks and gets regular mammograms done. The patient denies any new problems with the breasts at this time.      HPI  Past Medical History  Diagnosis Date  . Thyroid disease   . PONV (postoperative nausea and vomiting)   . Hypothyroidism 10-13-12    tx. supplement  . Arthritis 2008    osteoarthritis-left knee,s/p RTKA    Past Surgical History  Procedure Laterality Date  . Total knee arthroplasty  10/22/2012    Procedure: TOTAL KNEE ARTHROPLASTY;  Surgeon: Loanne Drilling, MD;  Location: [facility];  Service: Orthopedics;  Laterality: Left;  . Colonoscopy  2009    Dr Kinnie Scales  . Cholecystectomy  10/1998  . Joint replacement  2012    right  . Joint replacement Left 10/22/12    Knee  . Joint replacement Right 2006    Knee  . Breast surgery  06/21/11    left breast biopsy and SNL, pathology showed benign fibrocystic changes.    Family History  Problem Relation Age of Onset  . Cancer Mother     breast  . Cancer Father 6    pancreatic  . Cancer Maternal Aunt     breast  . Cancer Paternal Grandfather     lung    Social History History  Substance Use Topics  . Smoking status: Never Smoker   . Smokeless tobacco: Never Used  . Alcohol Use: No    Allergies  Allergen Reactions  . Macrolides And Ketolides Hives  . Tetracyclines & Related Hives  . Ampicillin Hives  . Erythromycin Hives  . Metronidazole Rash  . Sulfa Antibiotics Hives    All over chest    Current Outpatient Prescriptions  Medication Sig Dispense Refill  . Acetylcysteine (NAC) 600 MG CAPS Take by mouth.      . Beta Carotene (VITAMIN A) 25000 UNIT capsule Take 25,000 Units  by mouth daily.      . Cholecalciferol (VITAMIN D) 2000 UNITS CAPS Take 15,000 Units by mouth daily.      . Digestive Enzymes (DIGESTIVE ENZYME PO) Take by mouth.      . Ferrous Sulfate (IRON) 90 (18 FE) MG TABS Take by mouth.      . Iodine Strong, Lugols, (LUGOLS PO) Take 50 mg by mouth 2 (two) times daily.      Marland Kitchen MAGNESIUM GLYCINATE PLUS PO Take by mouth 2 (two) times daily.      . Multiple Vitamins-Minerals (MULTIVITAMIN PO) Take by mouth.      . NON FORMULARY DIM 300 mg with Broccoli 460 mg      . Nutritional Supplements (DHEA PO) Take 15 mg by mouth.      . Omega-3 Fatty Acids (FISH OIL) 500 MG CAPS Take 1 capsule by mouth 3 (three) times daily.      Marland Kitchen thyroid (ARMOUR) 120 MG tablet Take 120 mg by mouth daily before breakfast.        No current facility-administered medications for this visit.    Review of Systems Review of Systems  Constitutional: Negative.   Respiratory: Negative.   Cardiovascular: Negative.  Blood pressure 122/76, pulse 72, resp. rate 14, height 5' 6.5" (1.689 m), weight 200 lb (90.719 kg).  Physical Exam Physical Exam  Constitutional: She is oriented to person, place, and time. She appears well-developed and well-nourished.  Neck: Neck supple. No thyromegaly present.  Cardiovascular: Normal rate, regular rhythm and normal heart sounds.   No murmur heard. Pulmonary/Chest: Effort normal and breath sounds normal. Right breast exhibits no inverted nipple, no mass, no nipple discharge, no skin change and no tenderness. Left breast exhibits no inverted nipple, no mass, no nipple discharge, no skin change and no tenderness.    Mild volume loss of the left breast from surgery. Thickening present at the scar.  Musculoskeletal:       Arms: Lymphadenopathy:    She has no cervical adenopathy.    She has no axillary adenopathy.  Neurological: She is alert and oriented to person, place, and time.  Skin: Skin is warm and dry.  Left posterior upper shoulder 5 mm  nodule irregular with faint arrythemia.     Data Reviewed Left breast mammogram completed at [facility] dated 06/10/2014 was reviewed. An irregular mass is again noted measuring 2 x 3.8 cm. Unchanged from March 2015 exam. BI-RAD-4.  Assessment    Stable left breast mammogram, spiculated mass consistent with previously identified hematoma post biopsy/reexcision.  Change in long-standing nodular area of the left posterior shoulder present since childhood evaluation.    Plan    The radiologist's recommendation for repeat biopsy was again reviewed. At this time with no interval change and a history strongly suggestive of a traumatic source, the patient's very reluctant to consider repeat biopsy. She started out with a stereotactic biopsy at the beginning of her 2012 adventure, following this up with wide excision and is greatly concerned that a similar process complicated by hematoma will occur again.  The nodular area left posterior shoulder is unusual but does not have a typical picture of malignancy. It seemed reasonable to complete a punch biopsy. This was completed using 1 cc of 1% Xylocaine plain. A 2.5 mm punch was obtained. The defect was closed with 5-0Prolene suture x2. Telfa and Tegaderm applied. A postbiopsy wound care reviewed.  We'll plan on a followup exam in 6 months with bilateral diagnostic mammograms that time. She'll return in one week for suture removal by the staff. She'll be contacted when biopsy results are available.    PCP: Ivan Croft 06/17/2014, 3:52 PM

## 2014-06-16 NOTE — Patient Instructions (Signed)
Patient to return in 6 months with mammogram. Continue self breast exams. Call office for any new breast issues or concerns. Patient to return in 1 week for suture removal.

## 2014-06-17 LAB — PATHOLOGY

## 2014-06-18 ENCOUNTER — Telehealth: Payer: Self-pay

## 2014-06-18 NOTE — Telephone Encounter (Signed)
Notified patient as instructed, patient pleased. Discussed follow-up appointment on 06/23/14 at 9:30 am, patient agrees.

## 2014-06-18 NOTE — Telephone Encounter (Signed)
Message copied by Sinda Du on Fri Jun 18, 2014  8:55 AM ------      Message from: Lyman, Merrily Pew      Created: Fri Jun 18, 2014  7:35 AM       Notify patient shoulder lesion was benign thickened tissue.  No cancer.  The lesion not does not need to be removed in its entirety unless she is having discomfort.  Suture removal next week as scheduled. Thanks.       ----- Message -----         From: [facility] Lab Results In Interface         Sent: 06/17/2014   4:42 PM           To: Earline Mayotte, MD                   ------

## 2014-06-23 ENCOUNTER — Ambulatory Visit (INDEPENDENT_AMBULATORY_CARE_PROVIDER_SITE_OTHER): Payer: Self-pay | Admitting: *Deleted

## 2014-06-23 DIAGNOSIS — R229 Localized swelling, mass and lump, unspecified: Secondary | ICD-10-CM

## 2014-06-23 NOTE — Progress Notes (Signed)
The sutures were removed and steri strips applied. No sign of infection.

## 2014-07-15 ENCOUNTER — Encounter: Payer: Self-pay | Admitting: Physician Assistant

## 2014-07-25 ENCOUNTER — Encounter: Payer: Self-pay | Admitting: Physician Assistant

## 2014-07-26 ENCOUNTER — Encounter: Payer: Self-pay | Admitting: General Surgery

## 2014-11-26 ENCOUNTER — Encounter: Payer: Self-pay | Admitting: General Surgery

## 2014-12-06 ENCOUNTER — Ambulatory Visit: Payer: 59 | Admitting: General Surgery

## 2014-12-08 ENCOUNTER — Encounter: Payer: Self-pay | Admitting: General Surgery

## 2014-12-08 ENCOUNTER — Ambulatory Visit (INDEPENDENT_AMBULATORY_CARE_PROVIDER_SITE_OTHER): Payer: 59 | Admitting: General Surgery

## 2014-12-08 VITALS — BP 132/74 | HR 78 | Resp 12 | Ht 66.0 in | Wt 174.0 lb

## 2014-12-08 DIAGNOSIS — R928 Other abnormal and inconclusive findings on diagnostic imaging of breast: Secondary | ICD-10-CM

## 2014-12-08 NOTE — Progress Notes (Signed)
Patient ID: Linda S Hagerty, female   DOB: Dec 25, 1955, 59 y.o.   MRN: [id number]  Chief Complaint  PatConni Elliotient presents with  . Follow-up    mammogram    HPI Conni ElliotLeigh S Hecht is a 59 y.o. female who presents for a breast evaluation. The most recent mammogram was done on 11/24/14.  Patient does perform regular self breast checks and gets regular mammograms done.    HPI  Past Medical History  Diagnosis Date  . Thyroid disease   . PONV (postoperative nausea and vomiting)   . Hypothyroidism 10-13-12    tx. supplement  . Arthritis 2008    osteoarthritis-left knee,s/p RTKA    Past Surgical History  Procedure Laterality Date  . Total knee arthroplasty  10/22/2012    Procedure: TOTAL KNEE ARTHROPLASTY;  Surgeon: Loanne DrillingFrank V Aluisio, MD;  Location: [facility];  Service: Orthopedics;  Laterality: Left;  . Colonoscopy  2009    Dr Kinnie ScalesMedoff  . Cholecystectomy  10/1998  . Joint replacement  2012    right  . Joint replacement Left 10/22/12    Knee  . Joint replacement Right 2006    Knee  . Breast surgery  06/21/11    left breast biopsy and SNL, pathology showed benign fibrocystic changes.    Family History  Problem Relation Age of Onset  . Cancer Mother     breast  . Cancer Father [id number]    pancreatic  . Cancer Maternal Aunt     breast  . Cancer Paternal Grandfather     lung    Social History History  Substance Use Topics  . Smoking status: Never Smoker   . Smokeless tobacco: Never Used  . Alcohol Use: No    Allergies  Allergen Reactions  . Macrolides And Ketolides Hives  . Tetracyclines & Related Hives  . Ampicillin Hives  . Erythromycin Hives  . Metronidazole Rash  . Sulfa Antibiotics Hives    All over chest    Current Outpatient Prescriptions  Medication Sig Dispense Refill  . Acetylcysteine (NAC) 600 MG CAPS Take by mouth.    . Beta Carotene (VITAMIN A) 25000 UNIT capsule Take 25,000 Units by mouth daily.    . Cholecalciferol (VITAMIN D) 2000 UNITS CAPS Take 15,000 Units by mouth  daily.    . Digestive Enzymes (DIGESTIVE ENZYME PO) Take by mouth.    . Ferrous Sulfate (IRON) 90 (18 FE) MG TABS Take by mouth.    . Iodine Strong, Lugols, (LUGOLS PO) Take 50 mg by mouth 2 (two) times daily.    Marland Kitchen. MAGNESIUM GLYCINATE PLUS PO Take by mouth 2 (two) times daily.    . Multiple Vitamins-Minerals (MULTIVITAMIN PO) Take by mouth.    . NON FORMULARY DIM 300 mg with Broccoli 460 mg    . Nutritional Supplements (DHEA PO) Take 15 mg by mouth.    . Omega-3 Fatty Acids (FISH OIL) 500 MG CAPS Take 1 capsule by mouth 3 (three) times daily.    Marland Kitchen. thyroid (ARMOUR) 120 MG tablet Take 120 mg by mouth daily before breakfast.      No current facility-administered medications for this visit.    Review of Systems Review of Systems  Constitutional: Negative.   Respiratory: Negative.   Cardiovascular: Negative.     Blood pressure 132/74, pulse 78, resp. rate 12, height 5\' 6"  (1.676 m), weight 174 lb (78.926 kg).  Physical Exam Physical Exam  Constitutional: She is oriented to person, place, and time. She appears well-developed and well-nourished.  Eyes: Conjunctivae are normal. No scleral icterus.  Neck: Neck supple.  Cardiovascular: Normal rate, regular rhythm and normal heart sounds.   Pulmonary/Chest: Effort normal and breath sounds normal. Right breast exhibits no inverted nipple, no mass, no nipple discharge, no skin change and no tenderness. Left breast exhibits no inverted nipple, no mass, no nipple discharge, no skin change and no tenderness.    Abdominal: Soft. Bowel sounds are normal. There is no tenderness.  Lymphadenopathy:    She has no cervical adenopathy.  Neurological: She is alert and oriented to person, place, and time.  Skin: Skin is warm and dry.    Data Reviewed Bilateral diagnostic mammogram dated 11/26/2014 was reviewed from [facility]. Stable architectural distortion at the biopsy site is noted. Fat necrosis suspected.  BI-RADS-3.  Assessment    The patient's original biopsy in 2012 was complicated by significant hematoma formation I believe that are slow resolution of the hematoma.    Plan    The radiologist report was reviewed with the patient. Recommendation was for 6 month follow-up. Considering the study improvement since 2012 and the low clinical suspicion for malignancy I would be comfortable with a 1 year screening follow-up. At this time this is the patient's preferred management plan.  Due to her insurance is she may be changing primary care providers. If she has difficulty arranging for screening mammograms and she was encouraged to contact the office so they can be scheduled.  Patient to return as needed.     PCP: Ivan Croft 12/08/2014, 9:34 PM

## 2014-12-08 NOTE — Patient Instructions (Signed)
Patient to return as needed. 

## 2016-01-29 ENCOUNTER — Emergency Department
Admission: EM | Admit: 2016-01-29 | Discharge: 2016-01-29 | Disposition: A | Discharge status: Home / Self Care | Payer: BLUE CROSS/BLUE SHIELD | Attending: Emergency Medicine | Admitting: Emergency Medicine

## 2016-01-29 DIAGNOSIS — E039 Hypothyroidism, unspecified: Secondary | ICD-10-CM | POA: Insufficient documentation

## 2016-01-29 DIAGNOSIS — R55 Syncope and collapse: Secondary | ICD-10-CM | POA: Diagnosis present

## 2016-01-29 DIAGNOSIS — I951 Orthostatic hypotension: Secondary | ICD-10-CM | POA: Diagnosis not present

## 2016-01-29 LAB — CBC
HEMATOCRIT: 49.6 % — AB (ref 35.0–47.0)
Hemoglobin: 16.5 g/dL — ABNORMAL HIGH (ref 12.0–16.0)
MCH: 28.5 pg (ref 26.0–34.0)
MCHC: 33.3 g/dL (ref 32.0–36.0)
MCV: 85.6 fL (ref 80.0–100.0)
Platelets: 236 10*3/uL (ref 150–440)
RBC: 5.8 MIL/uL — AB (ref 3.80–5.20)
RDW: 13.2 % (ref 11.5–14.5)
WBC: 15.2 10*3/uL — AB (ref 3.6–11.0)

## 2016-01-29 LAB — BASIC METABOLIC PANEL
Anion gap: 10 (ref 5–15)
BUN: 20 mg/dL (ref 6–20)
CHLORIDE: 104 mmol/L (ref 101–111)
CO2: 25 mmol/L (ref 22–32)
Calcium: 9.6 mg/dL (ref 8.9–10.3)
Creatinine, Ser: 0.71 mg/dL (ref 0.44–1.00)
GFR calc non Af Amer: >60 mL/min (ref >60)
Glucose, Bld: 115 mg/dL — ABNORMAL HIGH (ref 65–99)
POTASSIUM: 4.2 mmol/L (ref 3.5–5.1)
SODIUM: 139 mmol/L (ref 135–145)

## 2016-01-29 MED ORDER — ONDANSETRON HCL 4 MG PO TABS: 4.0000 mg | ORAL_TABLET | Freq: Three times a day (TID) | ORAL | Status: AC | PRN

## 2016-01-29 MED ORDER — SODIUM CHLORIDE 0.9 % IV BOLUS (SEPSIS)
1000.0000 mL | Freq: Once | INTRAVENOUS | Status: AC
  Administered 2016-01-29: 1000 mL via INTRAVENOUS

## 2016-01-29 MED ORDER — BACITRACIN ZINC 500 UNIT/GM EX OINT
TOPICAL_OINTMENT | Freq: Once | CUTANEOUS | Status: AC
  Administered 2016-01-29: 1 via TOPICAL
  Filled 2016-01-29: qty 0.9

## 2016-01-29 NOTE — ED Notes (Signed)
Pt came to ED via EMS from home. Pt c/o nausea, vomiting, diarrhea that began after pt went to church today. Pt reports husband recently sick with GI bug. Pt had syncopal episode while in bathroom. Pts husband reports pt only out for 10-15 seconds. Pt has abrasion to left eye.

## 2016-01-29 NOTE — ED Notes (Signed)
Pt. Going home with family, will follow up with PCP tomorrowl

## 2016-01-29 NOTE — ED Provider Notes (Signed)
Time Seen: Approximately 1650*  I have reviewed the triage notes  Chief Complaint: Loss of Consciousness; Diarrhea; and Nausea   History of Present Illness: Gabriela [name] is a 60 y.o. female who was transported here by EMS from home for acute onset of nausea, vomiting and diarrhea. The patient had a brief syncopal episode in the bathroom today. She states her husband had a similar illness. She denies any presyncopal chest pain or shortness of breath. There was no witnessed seizure activity and she did strike the left side of her head and presents with an abrasion. She describes some mild headache mainly frontal in nature. She denies any hematemesis or biliary emesis. She denies any melena or hematochezia. She denies any focal abdominal pain. She denies any cardiovascular history. She denies any current chest pain or shortness of breath. She denies any recent antibiotic therapy.  Past Medical History  Diagnosis Date  . Thyroid disease   . PONV (postoperative nausea and vomiting)   . Hypothyroidism 10-13-12    tx. supplement  . Arthritis 2008    osteoarthritis-left knee,s/p RTKA    Patient Active Problem List   Diagnosis Date Noted  . Abnormal mammogram 12/29/2013  . Postoperative anemia due to acute blood loss 10/23/2012  . OA (osteoarthritis) of knee 10/22/2012  . Breast hematoma after procedure 07/20/2011  . Fibrocystic change of breast 07/04/2011    Past Surgical History  Procedure Laterality Date  . Total knee arthroplasty  10/22/2012    Procedure: TOTAL KNEE ARTHROPLASTY;  Surgeon: Loanne DrillingFrank V Aluisio, MD;  Location: [facility];  Service: Orthopedics;  Laterality: Left;  . Colonoscopy  2009    Dr Kinnie ScalesMedoff  . Cholecystectomy  10/1998  . Joint replacement  2012    right  . Joint replacement Left 10/22/12    Knee  . Joint replacement Right 2006    Knee  . Breast surgery  06/21/11    left breast biopsy and SNL, pathology showed benign fibrocystic changes.    Past Surgical History   Procedure Laterality Date  . Total knee arthroplasty  10/22/2012    Procedure: TOTAL KNEE ARTHROPLASTY;  Surgeon: Loanne DrillingFrank V Aluisio, MD;  Location: [facility];  Service: Orthopedics;  Laterality: Left;  . Colonoscopy  2009    Dr Kinnie ScalesMedoff  . Cholecystectomy  10/1998  . Joint replacement  2012    right  . Joint replacement Left 10/22/12    Knee  . Joint replacement Right 2006    Knee  . Breast surgery  06/21/11    left breast biopsy and SNL, pathology showed benign fibrocystic changes.    Current Outpatient Rx  Name  Route  Sig  Dispense  Refill  . Acetylcysteine (NAC) 600 MG CAPS   Oral   Take by mouth.         . Beta Carotene (VITAMIN A) 25000 UNIT capsule   Oral   Take 25,000 Units by mouth daily.         . Cholecalciferol (VITAMIN D) 2000 UNITS CAPS   Oral   Take 15,000 Units by mouth daily.         . Digestive Enzymes (DIGESTIVE ENZYME PO)   Oral   Take by mouth.         . Ferrous Sulfate (IRON) 90 (18 FE) MG TABS   Oral   Take by mouth.         . Iodine Strong, Lugols, (LUGOLS PO)   Oral   Take 50 mg by  mouth 2 (two) times daily.         Marland Kitchen MAGNESIUM GLYCINATE PLUS PO   Oral   Take by mouth 2 (two) times daily.         . Multiple Vitamins-Minerals (MULTIVITAMIN PO)   Oral   Take by mouth.         . NON FORMULARY      DIM 300 mg with Broccoli 460 mg         . Nutritional Supplements (DHEA PO)   Oral   Take 15 mg by mouth.         . Omega-3 Fatty Acids (FISH OIL) 500 MG CAPS   Oral   Take 1 capsule by mouth 3 (three) times daily.         . ondansetron (ZOFRAN) 4 MG tablet   Oral   Take 1 tablet (4 mg total) by mouth every 8 (eight) hours as needed for nausea or vomiting.   21 tablet   0   . thyroid (ARMOUR) 120 MG tablet   Oral   Take 120 mg by mouth daily before breakfast.            Allergies:  Macrolides and ketolides; Tetracyclines & related; Xarelto; Ampicillin; Erythromycin; Metronidazole; and Sulfa antibiotics  Family  History: Family History  Problem Relation Age of Onset  . Cancer Mother     breast  . Cancer Father 45    pancreatic  . Cancer Maternal Aunt     breast  . Cancer Paternal Grandfather     lung    Social History: Social History  Substance Use Topics  . Smoking status: Never Smoker   . Smokeless tobacco: Never Used  . Alcohol Use: No     Review of Systems:   10 point review of systems was performed and was otherwise negative:  Constitutional: No fever Eyes: No visual disturbances ENT: No sore throat, ear pain Cardiac: No chest pain Respiratory: No shortness of breath, wheezing, or stridor Abdomen: No abdominal pain, no vomiting, No diarrhea Endocrine: No weight loss, No night sweats Extremities: No peripheral edema, cyanosis Skin: No rashes, easy bruising Neurologic: No focal weakness, trouble with speech or swollowing Urologic: No dysuria, Hematuria, or urinary frequency   Physical Exam:  ED Triage Vitals  Enc Vitals Group     BP 01/29/16 1629 121/80 mmHg     Pulse Rate 01/29/16 1830 79     Resp 01/29/16 1629 12     Temp 01/29/16 1629 97.5 F (36.4 C)     Temp Source 01/29/16 1629 Oral     SpO2 01/29/16 1629 97 %     Weight 01/29/16 1629 155 lb (70.308 kg)     Height 01/29/16 1629  (1.676 m)     Head Cir --      Peak Flow --      Pain Score 01/29/16 1926 3     Pain Loc --      Pain Edu? --      Excl. in GC? --     General: Awake , Alert , and Oriented times 3; GCS 15 Head: Normal cephalic ,Small abrasion left temple region. Eyes: Pupils equal , round, reactive to light Nose/Throat: No nasal drainage, patent upper airway without erythema or exudate.  Neck: Supple, Full range of motion, No anterior adenopathy or palpable thyroid masses Lungs: Clear to ascultation without wheezes , rhonchi, or rales Heart: Regular rate, regular rhythm without murmurs , gallops , or  rubs Abdomen: Soft, non tender without rebound, guarding , or rigidity; bowel sounds  positive and symmetric in all 4 quadrants. No organomegaly .        Extremities: 2 plus symmetric pulses. No edema, clubbing or cyanosis Neurologic: normal ambulation, Motor symmetric without deficits, sensory intact Skin: warm, dry, no rashes   Labs:   All laboratory work was reviewed including any pertinent negatives or positives listed below:  Labs Reviewed  BASIC METABOLIC PANEL - Abnormal; Notable for the following:    Glucose, Bld 115 (*)    All other components within normal limits  CBC - Abnormal; Notable for the following:    WBC 15.2 (*)    RBC 5.80 (*)    Hemoglobin 16.5 (*)    HCT 49.6 (*)    All other components within normal limits  Or blood cell count slightly elevated otherwise all laboratory work within normal limits  EKG:  ED ECG REPORT I, Jennye Moccasin, the attending physician, personally viewed and interpreted this ECG.  Date: 01/29/2016 EKG Time: *1636 Rate: 77 Rhythm: normal sinus rhythm QRS Axis: normal Intervals: normal ST/T Wave abnormalities: Nonspecific ST and T-wave abnormalities Conduction Disturbances: none Narrative Interpretation: unremarkable No acute ischemic changes   ED Course: * Patient's stay was uneventful she was given an IV fluid bolus of normal saline 1 L. She was also given Zofran for nausea. She does not appear to have any signs of a surgical abdomen. We discussed her small abrasion on the left side of her face and her minor head trauma and I didn't feel head CT was necessary at this time. Patient feels symptomatically back to baseline I felt her syncopal episode was likely related to the nausea and vomiting and diarrhea. It may be vasovagal versus orthostatic.    Assessment: * Syncope Nausea, vomiting, diarrhea  Final Clinical Impression:   Final diagnoses:  Syncope due to orthostatic hypotension     Plan: * Outpatient management Patient was advised to return immediately if condition worsens. Patient was advised to  follow up with their primary care physician or other specialized physicians involved in their outpatient care. The patient and/or family member/power of attorney had laboratory results reviewed at the bedside. All questions and concerns were addressed and appropriate discharge instructions were distributed by the nursing staff.             Jennye Moccasin, MD 01/29/16 [phone]

## 2016-01-29 NOTE — Discharge Instructions (Signed)

## 2016-02-03 MED ORDER — HYDROCODONE-ACETAMINOPHEN 5-325 MG PO TABS
ORAL_TABLET | ORAL | Status: AC
  Filled 2016-02-03: qty 1

## 2019-12-04 ENCOUNTER — Ambulatory Visit: Payer: BC Managed Care – PPO | Attending: Internal Medicine

## 2019-12-04 DIAGNOSIS — Z23 Encounter for immunization: Secondary | ICD-10-CM

## 2019-12-04 NOTE — Progress Notes (Signed)
   Covid-19 Vaccination Clinic  Name:  Gabriela [name]    MRN: [id number] DOB: [birth year]  12/04/2019  [name] was observed post Covid-19 immunization for 15 minutes without incident. She was provided with Vaccine Information Sheet and instruction to access the V-Safe system.   [name] was instructed to call 911 with any severe reactions post vaccine: Marland Kitchen Difficulty breathing  . Swelling of face and throat  . A fast heartbeat  . A bad rash all over body  . Dizziness and weakness   Immunizations Administered    Name Date Dose VIS Date Route   Pfizer COVID-19 Vaccine 12/04/2019 10:19 AM 0.3 mL 09/04/2019 Intramuscular   Manufacturer: [employer], [employer]   Lot: [id number]   NDC: [address]-3

## 2019-12-29 ENCOUNTER — Ambulatory Visit: Payer: BC Managed Care – PPO | Attending: Internal Medicine

## 2019-12-29 DIAGNOSIS — Z23 Encounter for immunization: Secondary | ICD-10-CM

## 2019-12-29 NOTE — Progress Notes (Signed)
   Covid-19 Vaccination Clinic  Name:  Gabriela [name]    MRN: [id number] DOB: 07/11/1956  12/29/2019  Gabriela [name] was observed post Covid-19 immunization for 15 minutes without incident. She was provided with Vaccine Information Sheet and instruction to access the V-Safe system.   Gabriela [name] was instructed to call 911 with any severe reactions post vaccine: Marland Kitchen Difficulty breathing  . Swelling of face and throat  . A fast heartbeat  . A bad rash all over body  . Dizziness and weakness   Immunizations Administered    Name Date Dose VIS Date Route   Pfizer COVID-19 Vaccine 12/29/2019  9:54 AM 0.3 mL 09/04/2019 Intramuscular   Manufacturer: [employer], [employer]   Lot: [id number]   NDC: [address]-2

## 2021-11-22 DEATH — deceased

## 2022-06-06 LAB — COLOGUARD: COLOGUARD: NEGATIVE

## 2024-04-30 ENCOUNTER — Encounter: Payer: Self-pay | Admitting: Internal Medicine

## 2024-04-30 ENCOUNTER — Inpatient Hospital Stay: Attending: Internal Medicine | Admitting: Internal Medicine

## 2024-04-30 ENCOUNTER — Inpatient Hospital Stay

## 2024-04-30 VITALS — BP 126/99 | HR 89 | Temp 98.0°F | Resp 16 | Ht 66.0 in | Wt 228.5 lb

## 2024-04-30 DIAGNOSIS — E039 Hypothyroidism, unspecified: Secondary | ICD-10-CM | POA: Diagnosis not present

## 2024-04-30 DIAGNOSIS — D751 Secondary polycythemia: Secondary | ICD-10-CM | POA: Insufficient documentation

## 2024-04-30 DIAGNOSIS — Z801 Family history of malignant neoplasm of trachea, bronchus and lung: Secondary | ICD-10-CM | POA: Diagnosis not present

## 2024-04-30 DIAGNOSIS — Z803 Family history of malignant neoplasm of breast: Secondary | ICD-10-CM | POA: Diagnosis not present

## 2024-04-30 LAB — CBC WITH DIFFERENTIAL/PLATELET
Abs Immature Granulocytes: 0.02 K/uL (ref 0.00–0.07)
Basophils Absolute: 0 K/uL (ref 0.0–0.1)
Basophils Relative: 1 %
Eosinophils Absolute: 0.1 K/uL (ref 0.0–0.5)
Eosinophils Relative: 2 %
HCT: 49.3 % — ABNORMAL HIGH (ref 36.0–46.0)
Hemoglobin: 16.7 g/dL — ABNORMAL HIGH (ref 12.0–15.0)
Immature Granulocytes: 0 %
Lymphocytes Relative: 31 %
Lymphs Abs: 2.1 K/uL (ref 0.7–4.0)
MCH: 29.2 pg (ref 26.0–34.0)
MCHC: 33.9 g/dL (ref 30.0–36.0)
MCV: 86.3 fL (ref 80.0–100.0)
Monocytes Absolute: 0.6 K/uL (ref 0.1–1.0)
Monocytes Relative: 9 %
Neutro Abs: 3.8 K/uL (ref 1.7–7.7)
Neutrophils Relative %: 57 %
Platelets: 242 K/uL (ref 150–400)
RBC: 5.71 MIL/uL — ABNORMAL HIGH (ref 3.87–5.11)
RDW: 12.5 % (ref 11.5–15.5)
WBC: 6.6 K/uL (ref 4.0–10.5)
nRBC: 0 % (ref 0.0–0.2)

## 2024-04-30 LAB — LACTATE DEHYDROGENASE: LDH: 177 U/L (ref 98–192)

## 2024-04-30 LAB — COMPREHENSIVE METABOLIC PANEL WITH GFR
ALT: 33 U/L (ref 0–44)
AST: 32 U/L (ref 15–41)
Albumin: 4.2 g/dL (ref 3.5–5.0)
Alkaline Phosphatase: 85 U/L (ref 38–126)
Anion gap: 12 (ref 5–15)
BUN: 17 mg/dL (ref 8–23)
CO2: 24 mmol/L (ref 22–32)
Calcium: 9.4 mg/dL (ref 8.9–10.3)
Chloride: 104 mmol/L (ref 98–111)
Creatinine, Ser: 0.72 mg/dL (ref 0.44–1.00)
GFR, Estimated: >60 mL/min (ref >60)
Glucose, Bld: 93 mg/dL (ref 70–99)
Potassium: 4.2 mmol/L (ref 3.5–5.1)
Sodium: 140 mmol/L (ref 135–145)
Total Bilirubin: 1.3 mg/dL — ABNORMAL HIGH (ref 0.0–1.2)
Total Protein: 7.5 g/dL (ref 6.5–8.1)

## 2024-04-30 NOTE — Progress Notes (Signed)
 [facility] CONSULT NOTE  Patient Care Team: Ashley Boby LABOR, MD as PCP - General (Family Medicine) Dessa, Reyes ORN, MD (General Surgery) Ashley Boby LABOR, MD as Physician Assistant Rennie Cindy SAUNDERS, MD as Consulting Physician (Oncology) Rennie Cindy SAUNDERS, MD as Consulting Physician (Hematology and Oncology)  CHIEF COMPLAINTS/PURPOSE OF CONSULTATION: ERYTHROCYTOSIS   HEMATOLOGY HISTORY  # ERYTHROCYTOSIS  [Hb; WBC; platelets]  HISTORY OF PRESENTING ILLNESS: Patient ambulating-independently.   Alone   Gabriela [name] 68 y.o.  female pleasant patient was been referred to us  for further evaluation of erythrocytosis incidentally noted on routine blood work.  Worsening erythrocytosis since 2021. However, Patient denies any pulsating headaches.  Denies any worsening fatigue.   Smoking: none Lung Disease/COPD:none OSA/CPAP:? Intermittent Snoring- no sleep study  Hx of DVT/PE or Stroke: none Diuretics: none   Review of Systems  Constitutional:  Negative for chills, diaphoresis, fever, malaise/fatigue and weight loss.  HENT:  Negative for nosebleeds and sore throat.   Eyes:  Negative for double vision.  Respiratory:  Negative for cough, hemoptysis, sputum production, shortness of breath and wheezing.   Cardiovascular:  Negative for chest pain, palpitations, orthopnea and leg swelling.  Gastrointestinal:  Negative for abdominal pain, blood in stool, constipation, diarrhea, heartburn, melena, nausea and vomiting.  Genitourinary:  Negative for dysuria, frequency and urgency.  Musculoskeletal:  Negative for back pain and joint pain.  Skin: Negative.  Negative for itching and rash.  Neurological:  Negative for dizziness, tingling, focal weakness, weakness and headaches.  Endo/Heme/Allergies:  Does not bruise/bleed easily.  Psychiatric/Behavioral:  Negative for depression. The patient is not nervous/anxious and does not have insomnia.      MEDICAL HISTORY:  Past  Medical History:  Diagnosis Date   Arthritis 2008   osteoarthritis-left knee,s/p RTKA   Hypothyroidism 10-13-12   tx. supplement   PONV (postoperative nausea and vomiting)    Thyroid  disease     SURGICAL HISTORY: Past Surgical History:  Procedure Laterality Date   BREAST SURGERY  06/21/11   left breast biopsy and SNL, pathology showed benign fibrocystic changes.   CHOLECYSTECTOMY  10/1998   COLONOSCOPY  2009   Dr Luis   JOINT REPLACEMENT  2012   right   JOINT REPLACEMENT Left 10/22/12   Knee   JOINT REPLACEMENT Right 2006   Knee   TOTAL KNEE ARTHROPLASTY  10/22/2012   Procedure: TOTAL KNEE ARTHROPLASTY;  Surgeon: Dempsey LULLA Moan, MD;  Location: [facility];  Service: Orthopedics;  Laterality: Left;    SOCIAL HISTORY: Social History   Socioeconomic History   Marital status: Married    Spouse name: Not on file   Number of children: Not on file   Years of education: Not on file   Highest education level: Not on file  Occupational History   Not on file  Tobacco Use   Smoking status: Never   Smokeless tobacco: Never  Vaping Use   Vaping status: Never Used  Substance and Sexual Activity   Alcohol use: No   Drug use: No   Sexual activity: Not Currently  Other Topics Concern   Not on file  Social History Narrative   Not on file   Social Drivers of Health   Financial Resource Strain: Not on file  Food Insecurity: No Food Insecurity (04/30/2024)   Hunger Vital Sign    Worried About Running Out of Food in the Last Year: Never true    Ran Out of Food in the Last Year: Never true  Transportation Needs: No Transportation Needs (04/30/2024)   PRAPARE - Administrator, Civil Service (Medical): No    Lack of Transportation (Non-Medical): No  Physical Activity: Not on file  Stress: Not on file  Social Connections: Not on file  Intimate Partner Violence: Not At Risk (04/30/2024)   Humiliation, Afraid, Rape, and Kick questionnaire    Fear of Current or Ex-Partner: No     Emotionally Abused: No    Physically Abused: No    Sexually Abused: No    FAMILY HISTORY: Family History  Problem Relation Age of Onset   Cancer Mother        breast   Cancer Father 51       pancreatic   Cancer Maternal Aunt        breast   Cancer Paternal Grandfather        lung    ALLERGIES:  is allergic to macrolides and ketolides, tetracyclines & related, xarelto  [rivaroxaban ], ampicillin, erythromycin, metronidazole, and sulfa antibiotics.  MEDICATIONS:  Current Outpatient Medications  Medication Sig Dispense Refill   Acetylcysteine (NAC) 600 MG CAPS Take by mouth.     Beta Carotene (VITAMIN A) 25000 UNIT capsule Take 25,000 Units by mouth daily.     Cholecalciferol (VITAMIN D) 2000 UNITS CAPS Take 15,000 Units by mouth daily.     Iodine Strong, Lugols, (LUGOLS PO) Take 50 mg by mouth 2 (two) times daily.     levothyroxine (SYNTHROID) 137 MCG tablet Take 137 mcg by mouth daily.     Multiple Vitamins-Minerals (MULTIVITAMIN PO) Take by mouth.     NON FORMULARY DIM 300 mg with Broccoli 460 mg     Omega-3 Fatty Acids (FISH OIL) 500 MG CAPS Take 1 capsule by mouth 3 (three) times daily.     ondansetron  (ZOFRAN ) 4 MG tablet Take 1 tablet (4 mg total) by mouth every 8 (eight) hours as needed for nausea or vomiting. 21 tablet 0   SEMAGLUTIDE, 2 MG/DOSE, [place] Inject into the skin.     No current facility-administered medications for this visit.     PHYSICAL EXAMINATION:   Vitals:   04/30/24 1055  BP: (!) 126/99  Pulse: 89  Resp: 16  Temp: 98 F (36.7 C)  SpO2: 98%   Filed Weights   04/30/24 1055  Weight: 228 lb 8 oz (103.6 kg)    Physical Exam Vitals and nursing note reviewed.  HENT:     Head: Normocephalic and atraumatic.     Mouth/Throat:     Pharynx: Oropharynx is clear.  Eyes:     Extraocular Movements: Extraocular movements intact.     Pupils: Pupils are equal, round, and reactive to light.  Cardiovascular:     Rate and Rhythm: Normal rate and regular  rhythm.  Pulmonary:     Comments: Decreased breath sounds bilaterally.  Abdominal:     Palpations: Abdomen is soft.  Musculoskeletal:        General: Normal range of motion.     Cervical back: Normal range of motion.  Skin:    General: Skin is warm.  Neurological:     General: No focal deficit present.     Mental Status: She is alert and oriented to person, place, and time.  Psychiatric:        Behavior: Behavior normal.        Judgment: Judgment normal.      LABORATORY DATA:  I have reviewed the data as listed Lab Results  Component Value Date  WBC 6.6 04/30/2024   HGB 16.7 (H) 04/30/2024   HCT 49.3 (H) 04/30/2024   MCV 86.3 04/30/2024   PLT 242 04/30/2024   Recent Labs    04/30/24 1150  NA 140  K 4.2  CL 104  CO2 24  GLUCOSE 93  BUN 17  CREATININE 0.72  CALCIUM 9.4  GFRNONAA >60  PROT 7.5  ALBUMIN 4.2  AST 32  ALT 33  ALKPHOS 85  BILITOT 1.3*     No results found.  ASSESSMENT & PLAN:   Erythrocytosis #Erythrocytosis- [hb-16- HCT 48.9- wosening since 2021]-normal white count platelets. currently patient is asymptomatic. Patient is currently not on asprin.   # I had a long discussion the patient regarding the potential causes of her abnormal blood counts-both primary bone marrow problem versus reactive/secondary causes [smoking, OSA diuretics etc.].  Patient is non-smoker no diuretics-possible OSA.   # For now I  recommend checking CBC;CMP jak 2; LDH; erythropoietin  levels.  HOLD OFF bone marrow at this time.  # I discussed the role of phlebotomy in bringing the hematocrit/hemoglobin down-avoid any thromboembolic events.    Thank you Dr.Shleton MD for allowing me to participate in the care of your pleasant patient. Please do not hesitate to contact me with questions or concerns in the interim.  # DISPOSITION: # labs- ordered # follow up in 2-3 weeks- MD: no labs- Dr.B  # 45 minutes face-to-face with the patient discussing the above plan of care;  more than 50% of time spent on counseling and coordination. My contact information was given; and all questions were answered. The patient knows to call the clinic with any problems, questions or concerns.  Cc;Dr.Shleton    Cindy JONELLE Joe, MD 04/30/2024 5:44 PM

## 2024-04-30 NOTE — Progress Notes (Signed)
 This is Gabriela [name] wife that we saw yesterday.

## 2024-04-30 NOTE — Assessment & Plan Note (Addendum)
#  Erythrocytosis- [hb-16- HCT 48.9- wosening since 2021]-normal white count platelets. currently patient is asymptomatic. Patient is currently not on asprin.   # I had a long discussion the patient regarding the potential causes of her abnormal blood counts-both primary bone marrow problem versus reactive/secondary causes [smoking, OSA diuretics etc.].  Patient is non-smoker no diuretics-possible OSA.   # For now I  recommend checking CBC;CMP jak 2; LDH; erythropoietin  levels.  HOLD OFF bone marrow at this time.  # I discussed the role of phlebotomy in bringing the hematocrit/hemoglobin down-avoid any thromboembolic events.    Thank you Dr.Shleton MD for allowing me to participate in the care of your pleasant patient. Please do not hesitate to contact me with questions or concerns in the interim.  # DISPOSITION: # labs- ordered # follow up in 2-3 weeks- MD: no labs- Dr.B  # 45 minutes face-to-face with the patient discussing the above plan of care; more than 50% of time spent on counseling and coordination. My contact information was given; and all questions were answered. The patient knows to call the clinic with any problems, questions or concerns.  Cc;Dr.Shleton

## 2024-05-01 LAB — ERYTHROPOIETIN: Erythropoietin: 10.1 m[IU]/mL (ref 2.6–18.5)

## 2024-05-06 LAB — JAK2 GENOTYPR

## 2024-05-14 LAB — NGS JAK2 EXONS 12-15

## 2024-05-20 ENCOUNTER — Inpatient Hospital Stay: Admitting: Internal Medicine

## 2024-05-20 ENCOUNTER — Encounter: Payer: Self-pay | Admitting: Internal Medicine

## 2024-05-20 DIAGNOSIS — D751 Secondary polycythemia: Secondary | ICD-10-CM | POA: Diagnosis not present

## 2024-05-20 NOTE — Progress Notes (Signed)
 [facility] CONSULT NOTE  Patient Care Team: Ashley Boby LABOR, MD as PCP - General (Family Medicine) Dessa, Reyes ORN, MD (General Surgery) Ashley Boby LABOR, MD as Physician Assistant Rennie Cindy SAUNDERS, MD as Consulting Physician (Oncology) Rennie Cindy SAUNDERS, MD as Consulting Physician (Hematology and Oncology)  CHIEF COMPLAINTS/PURPOSE OF CONSULTATION: ERYTHROCYTOSIS   HEMATOLOGY HISTORY  # ERYTHROCYTOSIS  [Hb; WBC; platelets]  HISTORY OF PRESENTING ILLNESS: Patient ambulating-independently.   Alone   Gabriela [name] 68 y.o.  female pleasant patient was been referred to us  for further evaluation of erythrocytosis is here for a follow up/ and to review blood work.  Worsening erythrocytosis since 2021. However, Patient denies any pulsating headaches.  Denies any worsening fatigue.    Review of Systems  Constitutional:  Negative for chills, diaphoresis, fever, malaise/fatigue and weight loss.  HENT:  Negative for nosebleeds and sore throat.   Eyes:  Negative for double vision.  Respiratory:  Negative for cough, hemoptysis, sputum production, shortness of breath and wheezing.   Cardiovascular:  Negative for chest pain, palpitations, orthopnea and leg swelling.  Gastrointestinal:  Negative for abdominal pain, blood in stool, constipation, diarrhea, heartburn, melena, nausea and vomiting.  Genitourinary:  Negative for dysuria, frequency and urgency.  Musculoskeletal:  Negative for back pain and joint pain.  Skin: Negative.  Negative for itching and rash.  Neurological:  Negative for dizziness, tingling, focal weakness, weakness and headaches.  Endo/Heme/Allergies:  Does not bruise/bleed easily.  Psychiatric/Behavioral:  Negative for depression. The patient is not nervous/anxious and does not have insomnia.      MEDICAL HISTORY:  Past Medical History:  Diagnosis Date   Arthritis 2008   osteoarthritis-left knee,s/p RTKA   Hypothyroidism 10-13-12   tx.  supplement   PONV (postoperative nausea and vomiting)    Thyroid  disease     SURGICAL HISTORY: Past Surgical History:  Procedure Laterality Date   BREAST SURGERY  06/21/11   left breast biopsy and SNL, pathology showed benign fibrocystic changes.   CHOLECYSTECTOMY  10/1998   COLONOSCOPY  2009   Dr Luis   JOINT REPLACEMENT  2012   right   JOINT REPLACEMENT Left 10/22/12   Knee   JOINT REPLACEMENT Right 2006   Knee   TOTAL KNEE ARTHROPLASTY  10/22/2012   Procedure: TOTAL KNEE ARTHROPLASTY;  Surgeon: Dempsey LULLA Moan, MD;  Location: [facility];  Service: Orthopedics;  Laterality: Left;    SOCIAL HISTORY: Social History   Socioeconomic History   Marital status: Married    Spouse name: Not on file   Number of children: Not on file   Years of education: Not on file   Highest education level: Not on file  Occupational History   Not on file  Tobacco Use   Smoking status: Never   Smokeless tobacco: Never  Vaping Use   Vaping status: Never Used  Substance and Sexual Activity   Alcohol use: No   Drug use: No   Sexual activity: Not Currently  Other Topics Concern   Not on file  Social History Narrative   Not on file   Social Drivers of Health   Financial Resource Strain: Not on file  Food Insecurity: No Food Insecurity (04/30/2024)   Hunger Vital Sign    Worried About Running Out of Food in the Last Year: Never true    Ran Out of Food in the Last Year: Never true  Transportation Needs: No Transportation Needs (04/30/2024)   PRAPARE - Transportation    Lack  of Transportation (Medical): No    Lack of Transportation (Non-Medical): No  Physical Activity: Not on file  Stress: Not on file  Social Connections: Not on file  Intimate Partner Violence: Not At Risk (04/30/2024)   Humiliation, Afraid, Rape, and Kick questionnaire    Fear of Current or Ex-Partner: No    Emotionally Abused: No    Physically Abused: No    Sexually Abused: No    FAMILY HISTORY: Family History  Problem  Relation Age of Onset   Cancer Mother        breast   Cancer Father 38       pancreatic   Cancer Maternal Aunt        breast   Cancer Paternal Grandfather        lung    ALLERGIES:  is allergic to macrolides and ketolides, tetracyclines & related, xarelto  [rivaroxaban ], ampicillin, erythromycin, metronidazole, and sulfa antibiotics.  MEDICATIONS:  Current Outpatient Medications  Medication Sig Dispense Refill   Acetylcysteine (NAC) 600 MG CAPS Take by mouth.     Beta Carotene (VITAMIN A) 25000 UNIT capsule Take 25,000 Units by mouth daily.     Cholecalciferol (VITAMIN D) 2000 UNITS CAPS Take 15,000 Units by mouth daily.     Iodine Strong, Lugols, (LUGOLS PO) Take 50 mg by mouth 2 (two) times daily.     levothyroxine (SYNTHROID) 137 MCG tablet Take 137 mcg by mouth daily.     Multiple Vitamins-Minerals (MULTIVITAMIN PO) Take by mouth.     NON FORMULARY DIM 300 mg with Broccoli 460 mg     Omega-3 Fatty Acids (FISH OIL) 500 MG CAPS Take 1 capsule by mouth 3 (three) times daily.     ondansetron  (ZOFRAN ) 4 MG tablet Take 1 tablet (4 mg total) by mouth every 8 (eight) hours as needed for nausea or vomiting. 21 tablet 0   SEMAGLUTIDE, 2 MG/DOSE, [place] Inject into the skin.     No current facility-administered medications for this visit.     PHYSICAL EXAMINATION:   Vitals:   05/20/24 1505 05/20/24 1514  BP: (!) 125/95 (!) 129/102  Pulse: 97   Resp: 16   Temp: 98.2 F (36.8 C)   SpO2: 99%    Filed Weights   05/20/24 1505  Weight: 225 lb 6.4 oz (102.2 kg)    Physical Exam Vitals and nursing note reviewed.  HENT:     Head: Normocephalic and atraumatic.     Mouth/Throat:     Pharynx: Oropharynx is clear.  Eyes:     Extraocular Movements: Extraocular movements intact.     Pupils: Pupils are equal, round, and reactive to light.  Cardiovascular:     Rate and Rhythm: Normal rate and regular rhythm.  Pulmonary:     Comments: Decreased breath sounds bilaterally.  Abdominal:      Palpations: Abdomen is soft.  Musculoskeletal:        General: Normal range of motion.     Cervical back: Normal range of motion.  Skin:    General: Skin is warm.  Neurological:     General: No focal deficit present.     Mental Status: She is alert and oriented to person, place, and time.  Psychiatric:        Behavior: Behavior normal.        Judgment: Judgment normal.      LABORATORY DATA:  I have reviewed the data as listed Lab Results  Component Value Date   WBC 6.6 04/30/2024  HGB 16.7 (H) 04/30/2024   HCT 49.3 (H) 04/30/2024   MCV 86.3 04/30/2024   PLT 242 04/30/2024   Recent Labs    04/30/24 1150  NA 140  K 4.2  CL 104  CO2 24  GLUCOSE 93  BUN 17  CREATININE 0.72  CALCIUM 9.4  GFRNONAA >60  PROT 7.5  ALBUMIN 4.2  AST 32  ALT 33  ALKPHOS 85  BILITOT 1.3*     No results found.  ASSESSMENT & PLAN:   Erythrocytosis #SECONDARY Erythrocytosis- [hb-16- HCT 49 wosening since 2021]-normal white count platelets- currently patient is asymptomatic. JAK 2-NEG EPO- WNL-  HOLD OFF bone marrow at this time.  # recommend blood donation- twice a year. And also Sleep study- defer to PCP.   # DISPOSITION: # follow up as needed- Dr.B  Cc;Dr.Shleton    Cindy JONELLE Joe, MD 05/20/2024 3:33 PM

## 2024-05-20 NOTE — Progress Notes (Signed)
 Fatigue:  NO Headaches:  NO Dizziness or lightheadedness: NO Shortness of breath:  NO Redness of the skin: NO Joint pain:  NO Numbness or tingling:  NO Other symptoms:  itching, night sweats, and abdominal discomfort:  NO

## 2024-05-20 NOTE — Assessment & Plan Note (Addendum)
#  SECONDARY Erythrocytosis- [hb-16- HCT 49 wosening since 2021]-normal white count platelets- currently patient is asymptomatic. JAK 2-NEG EPO- WNL-  HOLD OFF bone marrow at this time.  # recommend blood donation- twice a year. And also Sleep study- defer to PCP.   # DISPOSITION: # follow up as needed- Dr.B  Cc;Dr.Shleton
